# Patient Record
Sex: Female | Born: 1980 | Race: White | Hispanic: No | Smoking: Current some day smoker
Health system: Southern US, Community
[De-identification: ages and names within clinical notes are randomized; demographics above are authoritative.]

## PROBLEM LIST (undated history)

## (undated) DIAGNOSIS — J189 Pneumonia, unspecified organism: Secondary | ICD-10-CM

## (undated) DIAGNOSIS — F419 Anxiety disorder, unspecified: Secondary | ICD-10-CM

## (undated) DIAGNOSIS — E559 Vitamin D deficiency, unspecified: Secondary | ICD-10-CM

## (undated) DIAGNOSIS — I10 Essential (primary) hypertension: Secondary | ICD-10-CM

## (undated) DIAGNOSIS — J42 Unspecified chronic bronchitis: Secondary | ICD-10-CM

## (undated) DIAGNOSIS — Z8619 Personal history of other infectious and parasitic diseases: Secondary | ICD-10-CM

## (undated) DIAGNOSIS — G43909 Migraine, unspecified, not intractable, without status migrainosus: Secondary | ICD-10-CM

## (undated) HISTORY — DX: Personal history of other infectious and parasitic diseases: Z86.19

## (undated) HISTORY — DX: Vitamin D deficiency, unspecified: E55.9

## (undated) HISTORY — DX: Anxiety disorder, unspecified: F41.9

## (undated) HISTORY — DX: Migraine, unspecified, not intractable, without status migrainosus: G43.909

## (undated) HISTORY — DX: Essential (primary) hypertension: I10

## (undated) HISTORY — DX: Pneumonia, unspecified organism: J18.9

## (undated) HISTORY — DX: Unspecified chronic bronchitis: J42

---
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
## 2001-04-26 ENCOUNTER — Emergency Department (HOSPITAL_COMMUNITY): Admission: EM | Admit: 2001-04-26 | Discharge: 2001-04-26 | Payer: Self-pay | Admitting: Emergency Medicine

## 2001-04-27 ENCOUNTER — Encounter: Payer: Self-pay | Admitting: Emergency Medicine

## 2001-10-27 ENCOUNTER — Emergency Department (HOSPITAL_COMMUNITY): Admission: EM | Admit: 2001-10-27 | Discharge: 2001-10-27 | Payer: Self-pay | Admitting: Emergency Medicine

## 2001-11-08 ENCOUNTER — Encounter: Payer: Self-pay | Admitting: Emergency Medicine

## 2001-11-08 ENCOUNTER — Emergency Department (HOSPITAL_COMMUNITY): Admission: EM | Admit: 2001-11-08 | Discharge: 2001-11-08 | Payer: Self-pay | Admitting: Emergency Medicine

## 2004-04-28 ENCOUNTER — Emergency Department (HOSPITAL_COMMUNITY): Admission: EM | Admit: 2004-04-28 | Discharge: 2004-04-29 | Payer: Self-pay | Admitting: Emergency Medicine

## 2007-05-03 ENCOUNTER — Emergency Department (HOSPITAL_COMMUNITY): Admission: EM | Admit: 2007-05-03 | Discharge: 2007-05-03 | Payer: Self-pay | Admitting: Emergency Medicine

## 2007-05-05 ENCOUNTER — Emergency Department (HOSPITAL_COMMUNITY): Admission: EM | Admit: 2007-05-05 | Discharge: 2007-05-05 | Payer: Self-pay | Admitting: Emergency Medicine

## 2007-10-31 ENCOUNTER — Emergency Department (HOSPITAL_COMMUNITY): Admission: EM | Admit: 2007-10-31 | Discharge: 2007-10-31 | Payer: Self-pay | Admitting: Emergency Medicine

## 2009-10-01 ENCOUNTER — Inpatient Hospital Stay (HOSPITAL_COMMUNITY): Admission: AD | Admit: 2009-10-01 | Discharge: 2009-10-01 | Payer: Self-pay | Admitting: Family Medicine

## 2009-10-01 ENCOUNTER — Ambulatory Visit: Payer: Self-pay | Admitting: Obstetrics and Gynecology

## 2010-09-30 LAB — URINALYSIS, ROUTINE W REFLEX MICROSCOPIC
Bilirubin Urine: NEGATIVE
Ketones, ur: NEGATIVE mg/dL
Nitrite: NEGATIVE
Protein, ur: NEGATIVE mg/dL
Urobilinogen, UA: 0.2 mg/dL (ref 0.0–1.0)

## 2010-09-30 LAB — WET PREP, GENITAL

## 2010-09-30 LAB — POCT PREGNANCY, URINE: Preg Test, Ur: POSITIVE

## 2011-04-16 LAB — URINALYSIS, ROUTINE W REFLEX MICROSCOPIC
Glucose, UA: NEGATIVE
Ketones, ur: 15 — AB
Nitrite: NEGATIVE
Protein, ur: 100 — AB
Urobilinogen, UA: 1

## 2011-04-16 LAB — DIFFERENTIAL
Basophils Absolute: 0
Eosinophils Absolute: 0.1
Eosinophils Relative: 1
Lymphocytes Relative: 26

## 2011-04-16 LAB — CBC
HCT: 39.8
MCV: 89.7
Platelets: 262
RDW: 12.9

## 2011-04-16 LAB — URINE CULTURE: Colony Count: NO GROWTH

## 2011-04-16 LAB — I-STAT 8, (EC8 V) (CONVERTED LAB)
Acid-base deficit: 2
Bicarbonate: 21.3
HCT: 42
Operator id: 198171
pCO2, Ven: 30.4 — ABNORMAL LOW

## 2011-04-16 LAB — POCT PREGNANCY, URINE
Operator id: 196461
Preg Test, Ur: NEGATIVE

## 2011-04-16 LAB — URINE MICROSCOPIC-ADD ON

## 2015-04-27 ENCOUNTER — Ambulatory Visit (INDEPENDENT_AMBULATORY_CARE_PROVIDER_SITE_OTHER): Payer: Self-pay | Admitting: Family Medicine

## 2015-04-27 ENCOUNTER — Encounter: Payer: Self-pay | Admitting: Family Medicine

## 2015-04-27 VITALS — BP 130/88 | HR 79 | Temp 98.4°F | Resp 20 | Ht 63.0 in | Wt 162.0 lb

## 2015-04-27 DIAGNOSIS — G43001 Migraine without aura, not intractable, with status migrainosus: Secondary | ICD-10-CM

## 2015-04-27 DIAGNOSIS — Z720 Tobacco use: Secondary | ICD-10-CM | POA: Insufficient documentation

## 2015-04-27 DIAGNOSIS — Z6828 Body mass index (BMI) 28.0-28.9, adult: Secondary | ICD-10-CM | POA: Insufficient documentation

## 2015-04-27 DIAGNOSIS — Z7689 Persons encountering health services in other specified circumstances: Secondary | ICD-10-CM | POA: Insufficient documentation

## 2015-04-27 DIAGNOSIS — Z0000 Encounter for general adult medical examination without abnormal findings: Secondary | ICD-10-CM | POA: Insufficient documentation

## 2015-04-27 DIAGNOSIS — Z7189 Other specified counseling: Secondary | ICD-10-CM

## 2015-04-27 DIAGNOSIS — R5383 Other fatigue: Secondary | ICD-10-CM | POA: Insufficient documentation

## 2015-04-27 DIAGNOSIS — G43909 Migraine, unspecified, not intractable, without status migrainosus: Secondary | ICD-10-CM | POA: Insufficient documentation

## 2015-04-27 LAB — VITAMIN B12: Vitamin B-12: 153 pg/mL — ABNORMAL LOW (ref 211–911)

## 2015-04-27 LAB — VITAMIN D 25 HYDROXY (VIT D DEFICIENCY, FRACTURES): VITD: 23.09 ng/mL — ABNORMAL LOW (ref 30.00–100.00)

## 2015-04-27 LAB — HEMOGLOBIN A1C: Hgb A1c MFr Bld: 5.7 % (ref 4.6–6.5)

## 2015-04-27 LAB — COMPREHENSIVE METABOLIC PANEL
ALK PHOS: 69 U/L (ref 39–117)
ALT: 18 U/L (ref 0–35)
AST: 16 U/L (ref 0–37)
Albumin: 3.9 g/dL (ref 3.5–5.2)
BILIRUBIN TOTAL: 0.4 mg/dL (ref 0.2–1.2)
BUN: 10 mg/dL (ref 6–23)
CALCIUM: 9.3 mg/dL (ref 8.4–10.5)
CO2: 27 mEq/L (ref 19–32)
Chloride: 104 mEq/L (ref 96–112)
Creatinine, Ser: 0.75 mg/dL (ref 0.40–1.20)
GFR: 93.72 mL/min (ref >60.00)
Glucose, Bld: 115 mg/dL — ABNORMAL HIGH (ref 70–99)
Potassium: 4.1 mEq/L (ref 3.5–5.1)
Sodium: 137 mEq/L (ref 135–145)
TOTAL PROTEIN: 7.6 g/dL (ref 6.0–8.3)

## 2015-04-27 LAB — TSH: TSH: 1.02 u[IU]/mL (ref 0.35–4.50)

## 2015-04-27 MED ORDER — SUMATRIPTAN SUCCINATE 50 MG PO TABS: 50.0000 mg | ORAL_TABLET | ORAL | Status: DC | PRN

## 2015-04-27 NOTE — Progress Notes (Signed)
Subjective:    Patient ID: Donna Bright, female    DOB: July 03, 1981, 34 y.o.   MRN: [id number]  HPI  Patient presents for new patient establishment . All past medical history, surgical history, allergies, family history, immunizations and social history was obtained from the patient today and entered into the electronic medical record. Records are requested from her prior PCP, and will be reviewed at the time they are received. All medical records will be updated at that time.   Migraines: Patient reports history of migraines that started approximately 2 years ago. She states that she gets a migraine approximately one time a week. She currently only takes ibuprofen, which she states is not effective. She's never been tried on any types of medications, or seek medical treatment for her migraines. She does admit to photophobia and occasional nausea with her migraines. She states that she does try to go into a dark room and rest, and that seems to be the only thing that does ease off her headache. She does not know of any specific triggers to her migraines.   Tobacco abuse: Patient declines smoking cessation today.`  Health maintenance:  Colonoscopy: No fhx, routine screening at 50 Mammogram: No fhx, routine screening 50 Cervical cancer screening: 2011 last PAP.   Immunizations: td indicated, flu indicated.  Infectious disease screening: HIV indicated   Past Medical History  Diagnosis Date  . Migraines    No Known Allergies Past Surgical History  Procedure Laterality Date  . Cesarean section     Family History  Problem Relation Age of Onset  . Stroke Maternal Grandfather   . Diabetes Maternal Grandfather    Social History   Social History  . Marital Status: Legally Separated    Spouse Name: N/A  . Number of Children: N/A  . Years of Education: N/A   Occupational History  . Not on file.   Social History Main Topics  . Smoking status: Current Every Day Smoker -- 1.00  packs/day    Types: Cigarettes  . Smokeless tobacco: Current User    Types: Chew  . Alcohol Use: Not on file  . Drug Use: Not on file  . Sexual Activity: Not on file   Other Topics Concern  . Not on file   Social History Narrative  . No narrative on file   Review of Systems Negative, with the exception of above mentioned in HPI     Objective:   Physical Exam BP 130/88 mmHg  Pulse 79  Temp(Src) 98.4 F (36.9 C) (Oral)  Resp 20  Ht 5' 3"  (1.6 m)  Wt 162 lb (73.483 kg)  BMI 28.70 kg/m2  SpO2 98%  LMP 04/27/2015 Gen: Afebrile. No acute distress. No acute distress, nontoxic appearance, well-developed, well-nourished, Caucasian female. Very pleasant. HENT: AT. [place]. . MMM. Eyes:Pupils Equal Round Reactive to light, Extraocular movements intact,  Conjunctiva without redness, discharge or icterus. Neck/lymp/endocrine: Supple, no lymphadenopathy, no thyromegaly CV: RRR no murmurs appreciated, no edema, +2/4 P posterior tibialis pulses Chest: CTAB, no wheeze or crackles Abd: Soft. Overweight. NTND. BS present. No Masses palpated.  Neuro: Normal gait. PERLA. EOMi. Alert. Orientedx3 Cranial nerves II through XII intact.  Psych: Normal affect, dress and demeanor. Normal speech. Normal thought content and judgment..      Assessment & Plan:  1. BMI 28.0-28.9,adult - Encouraged at least 150 minutes of exercise weekly, healthy diet full of fresh fruits and vegetables, high-fiber, and low in saturated fats. - HgB A1c - B12 -  Vitamin D (25 hydroxy) - TSH - Comp Met (CMET)  2. Migraine without aura and with status migrainosus, not intractable - Patient to make migraine diary, return 4 weeks. Trial of Imitrex. Depending on frequency and severity of symptoms, would consider daily prophylaxis versus neurological referral. - SUMAtriptan (IMITREX) 50 MG tablet; Take 1 tablet (50 mg total) by mouth every 2 (two) hours as needed for migraine. May repeat in 2 hours if headache persists or  recurs.  Dispense: 10 tablet; Refill: 0  3. Health care maintenance Health maintenance:  Colonoscopy: No fhx, routine screening at 50 Mammogram: No fhx, routine screening 50 Cervical cancer screening: 2011 last PAP.   Immunizations: td decline, flu declined.  Infectious disease screening: HIV declined  4. Other fatigue - HgB A1c - B12 - Vitamin D (25 hydroxy) - TSH - Comp Met (CMET)  5. Encounter to establish care 6. tobacco abuse: Patient declined smoking cessation counseling today patient made aware that she will decide quit smoking that we are here to help her quit.

## 2015-04-27 NOTE — Patient Instructions (Signed)
It was a pleasure meeting you today.  I will call you with labs once available Try to increase your exercise to 3 times a week.  Record a headache diary: dates, time, length, foods, stressors etc included.   Smoking Cessation, Tips for Success If you are ready to quit smoking, congratulations! You have chosen to help yourself be healthier. Cigarettes bring nicotine, tar, carbon monoxide, and other irritants into your body. Your lungs, heart, and blood vessels will be able to work better without these poisons. There are many different ways to quit smoking. Nicotine gum, nicotine patches, a nicotine inhaler, or nicotine nasal spray can help with physical craving. Hypnosis, support groups, and medicines help break the habit of smoking. WHAT THINGS CAN I DO TO MAKE QUITTING EASIER?  Here are some tips to help you quit for good:  Pick a date when you will quit smoking completely. Tell all of your friends and family about your plan to quit on that date.  Do not try to slowly cut down on the number of cigarettes you are smoking. Pick a quit date and quit smoking completely starting on that day.  Throw away all cigarettes.   Clean and remove all ashtrays from your home, work, and car.  On a card, write down your reasons for quitting. Carry the card with you and read it when you get the urge to smoke.  Cleanse your body of nicotine. Drink enough water and fluids to keep your urine clear or pale yellow. Do this after quitting to flush the nicotine from your body.  Learn to predict your moods. Do not let a bad situation be your excuse to have a cigarette. Some situations in your life might tempt you into wanting a cigarette.  Never have "just one" cigarette. It leads to wanting another and another. Remind yourself of your decision to quit.  Change habits associated with smoking. If you smoked while driving or when feeling stressed, try other activities to replace smoking. Stand up when drinking your  coffee. Brush your teeth after eating. Sit in a different chair when you read the paper. Avoid alcohol while trying to quit, and try to drink fewer caffeinated beverages. Alcohol and caffeine may urge you to smoke.  Avoid foods and drinks that can trigger a desire to smoke, such as sugary or spicy foods and alcohol.  Ask people who smoke not to smoke around you.  Have something planned to do right after eating or having a cup of coffee. For example, plan to take a walk or exercise.  Try a relaxation exercise to calm you down and decrease your stress. Remember, you may be tense and nervous for the first 2 weeks after you quit, but this will pass.  Find new activities to keep your hands busy. Play with a pen, coin, or rubber band. Doodle or draw things on paper.  Brush your teeth right after eating. This will help cut down on the craving for the taste of tobacco after meals. You can also try mouthwash.   Use oral substitutes in place of cigarettes. Try using lemon drops, carrots, cinnamon sticks, or chewing gum. Keep them handy so they are available when you have the urge to smoke.  When you have the urge to smoke, try deep breathing.  Designate your home as a nonsmoking area.  If you are a heavy smoker, ask your health care provider about a prescription for nicotine chewing gum. It can ease your withdrawal from nicotine.  Reward  yourself. Set aside the cigarette money you save and buy yourself something nice.  Look for support from others. Join a support group or smoking cessation program. Ask someone at home or at work to help you with your plan to quit smoking.  Always ask yourself, "Do I need this cigarette or is this just a reflex?" Tell yourself, "Today, I choose not to smoke," or "I do not want to smoke." You are reminding yourself of your decision to quit.  Do not replace cigarette smoking with electronic cigarettes (commonly called e-cigarettes). The safety of e-cigarettes is  unknown, and some may contain harmful chemicals.  If you relapse, do not give up! Plan ahead and think about what you will do the next time you get the urge to smoke. HOW WILL I FEEL WHEN I QUIT SMOKING? You may have symptoms of withdrawal because your body is used to nicotine (the addictive substance in cigarettes). You may crave cigarettes, be irritable, feel very hungry, cough often, get headaches, or have difficulty concentrating. The withdrawal symptoms are only temporary. They are strongest when you first quit but will go away within 10-14 days. When withdrawal symptoms occur, stay in control. Think about your reasons for quitting. Remind yourself that these are signs that your body is healing and getting used to being without cigarettes. Remember that withdrawal symptoms are easier to treat than the major diseases that smoking can cause.  Even after the withdrawal is over, expect periodic urges to smoke. However, these cravings are generally short lived and will go away whether you smoke or not. Do not smoke! WHAT RESOURCES ARE AVAILABLE TO HELP ME QUIT SMOKING? Your health care provider can direct you to community resources or hospitals for support, which may include:  Group support.  Education.  Hypnosis.  Therapy.   This information is not intended to replace advice given to you by your health care provider. Make sure you discuss any questions you have with your health care provider.   Document Released: 03/21/2004 Document Revised: 07/14/2014 Document Reviewed: 12/09/2012 Elsevier Interactive Patient Education [employer]

## 2015-04-30 ENCOUNTER — Telehealth: Payer: Self-pay | Admitting: Family Medicine

## 2015-04-30 DIAGNOSIS — E538 Deficiency of other specified B group vitamins: Secondary | ICD-10-CM | POA: Insufficient documentation

## 2015-04-30 DIAGNOSIS — E559 Vitamin D deficiency, unspecified: Secondary | ICD-10-CM | POA: Insufficient documentation

## 2015-04-30 MED ORDER — VITAMIN D (ERGOCALCIFEROL) 1.25 MG (50000 UNIT) PO CAPS: 50000.0000 [IU] | ORAL_CAPSULE | ORAL | Status: DC

## 2015-04-30 MED ORDER — VITAMIN B-12 1000 MCG PO TABS: ORAL_TABLET | ORAL | Status: DC

## 2015-04-30 NOTE — Telephone Encounter (Signed)
Please call patient: - She has very low B-12 and a very low vitamin D. - We will need to start supplementation on both. I have called in a vitamin D supplement for her to take once a week for 12 weeks, we will need to recheck her vitamin D after completion. - Since she does not have insurance, I have called in vitamin B 12 oral supplementation. This will likely be cheaper than the injections. She will need to take these as directed and we will need to follow up her B-12 levels at the same time as her vitamin D levels. - Her a1c, monitors diabetes is mildly elevated at 5.7. We recommend dietary changes at this level, she is not considered a diabetic at this time but is considered a prediabetic. She should decrease the sugar content, and carbohydrate content in her diet. He plenty of healthy fresh fruits and vegetables, lean meats. We will recheck her A1c in 6 months.

## 2015-04-30 NOTE — Telephone Encounter (Signed)
Patient aware of results, diet and prescriptions at pharmacy.  Pt had no further questions at this time.

## 2015-05-28 ENCOUNTER — Other Ambulatory Visit: Payer: Self-pay | Admitting: *Deleted

## 2015-05-28 DIAGNOSIS — E538 Deficiency of other specified B group vitamins: Secondary | ICD-10-CM

## 2015-05-28 MED ORDER — VITAMIN B-12 1000 MCG PO TABS: ORAL_TABLET | ORAL | Status: DC

## 2015-05-28 NOTE — Telephone Encounter (Signed)
B12 refilled per refill protocol

## 2015-07-19 ENCOUNTER — Other Ambulatory Visit: Payer: Self-pay | Admitting: *Deleted

## 2015-07-19 ENCOUNTER — Telehealth: Payer: Self-pay | Admitting: Family Medicine

## 2015-07-19 DIAGNOSIS — E559 Vitamin D deficiency, unspecified: Secondary | ICD-10-CM

## 2015-07-19 NOTE — Telephone Encounter (Signed)
Please call pt: - I received a request for vit d refill, we would only do refill for prescribed dose if we retest her level and it is low again.  - She needs her vit D  And B12 rechecked to make certain medications brought her levels to normal.  - I believe she was having insurance issues, and may not have coverage currently to pay for re-testing.  - she should continue OTC b12 and Vit d ([phone]) regardless if we test again or not.  - If she desires levels to be drawn please place future order for her

## 2015-07-19 NOTE — Telephone Encounter (Signed)
RF request for Vitamin D 50, 000units LOV: 04/27/15 Next ov: None Last written: 04/30/15 #12 w/ 0RF  Please advise.Thanks.

## 2015-07-19 NOTE — Telephone Encounter (Signed)
Spoke to pt and she stated that she does not have insurance and would like to take the otc B12 and Vit D.

## 2020-05-26 ENCOUNTER — Encounter (HOSPITAL_COMMUNITY): Payer: Self-pay | Admitting: Emergency Medicine

## 2020-05-26 ENCOUNTER — Emergency Department (HOSPITAL_COMMUNITY): Payer: No Typology Code available for payment source

## 2020-05-26 ENCOUNTER — Other Ambulatory Visit: Payer: Self-pay

## 2020-05-26 ENCOUNTER — Emergency Department (HOSPITAL_COMMUNITY)
Admission: EM | Admit: 2020-05-26 | Discharge: 2020-05-26 | Disposition: A | Discharge status: Home / Self Care | Payer: No Typology Code available for payment source | Attending: Emergency Medicine | Admitting: Emergency Medicine

## 2020-05-26 DIAGNOSIS — F17210 Nicotine dependence, cigarettes, uncomplicated: Secondary | ICD-10-CM | POA: Diagnosis not present

## 2020-05-26 DIAGNOSIS — M25512 Pain in left shoulder: Secondary | ICD-10-CM | POA: Diagnosis present

## 2020-05-26 NOTE — ED Triage Notes (Signed)
Patient arrives to ED with complaints of left shoulder pain x1 week. Pt states pain is sharp and is intermittent. OTC aleve tends to help pain. No direct injury to shoulder.

## 2020-05-26 NOTE — Discharge Instructions (Signed)
You were seen in the ER for shoulder pain  X-ray was normal  Your pain is likely from inflammation  Alternate ibuprofen (advil, motrin), aleve or tylenol every 6-8 hours for the next 5 days  Rest, use sling as needed for comfort  Ice  Follow up with primary care doctor in 1 week for re-evaluation if pain not improving  Return for worsening or new symptoms

## 2020-05-26 NOTE — ED Provider Notes (Signed)
[facility] EMERGENCY DEPARTMENT Provider Note   CSN: [id number] Arrival date & time: 05/26/20  1436     History Chief Complaint  Patient presents with   Shoulder Pain    Donna Bright is a 39 y.o. female presents to the ED for evaluation of left-sided shoulder pain for the last week.  Constant.  Located along left trapezius with radiation to the lateral shoulder, left-sided neck.  Worse with movement, palpation, laying on it.  Has taken Aleve which helps with the pain.  She is right-handed.  No previous known injuries, surgeries, fractures or dislocations of this joint.  Denies associated distal tingling, decreased sensation, weakness, chest pain, shortness of breath, back pain.  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  Denies history of rheumatoid arthritis.  States she feels "stiff".  No fevers, rash, redness.  HPI     Past Medical History:  Diagnosis Date   History of chicken pox    Migraines     Patient Active Problem List   Diagnosis Date Noted   Vitamin D deficiency 04/30/2015   Vitamin B12 deficiency 04/30/2015   BMI 28.0-28.9,adult 04/27/2015   Migraine 04/27/2015   Health care maintenance 04/27/2015   Fatigue 04/27/2015   Encounter to establish care 04/27/2015   Tobacco abuse 04/27/2015    Past Surgical History:  Procedure Laterality Date   CESAREAN SECTION       OB History   No obstetric history on file.     Family History  Problem Relation Age of Onset   Stroke Maternal Grandfather    Diabetes Maternal Grandfather     Social History   Tobacco Use   Smoking status: Current Every Day Smoker    Packs/day: 1.00    Types: Cigarettes   Smokeless tobacco: Current User  Substance Use Topics   Alcohol use: No    Alcohol/week: 0.0 standard drinks   Drug use: No    Home Medications Prior to Admission medications   Medication Sig Start Date End  Date Taking? Authorizing Provider  SUMAtriptan (IMITREX) 50 MG tablet Take 1 tablet (50 mg total) by mouth every 2 (two) hours as needed for migraine. May repeat in 2 hours if headache persists or recurs. 04/27/15   Kuneff, Renee A, DO  vitamin B-12 (CYANOCOBALAMIN) 1000 MCG tablet take 1000 mcg monthly 05/28/15   Kuneff, Renee A, DO  Vitamin D, Ergocalciferol, (DRISDOL) 50000 UNITS CAPS capsule Take 1 capsule (50,000 Units total) by mouth every 7 (seven) days. 04/30/15   Kuneff, Renee A, DO    Allergies    Patient has no known allergies.  Review of Systems   Review of Systems  Musculoskeletal: Positive for arthralgias and myalgias.  All other systems reviewed and are negative.   Physical Exam Updated Vital Signs BP 130/84 (BP Location: Right Arm)    Pulse 87    Temp 98 F (36.7 C) (Oral)    Resp 16    Ht 5\' 1"  (1.549 m)    Wt 72.6 kg    SpO2 100%    BMI 30.23 kg/m   Physical Exam Vitals and nursing note reviewed.  Constitutional:      General: She is not in acute distress.    Appearance: She is well-developed.     Comments: NAD.  HENT:     Head: Normocephalic and atraumatic.     Right Ear: External ear normal.  Left Ear: External ear normal.     Nose: Nose normal.  Eyes:     General: No scleral icterus.    Conjunctiva/sclera: Conjunctivae normal.  Cardiovascular:     Rate and Rhythm: Normal rate and regular rhythm.     Heart sounds: Normal heart sounds.     Comments: 1+ radial LUE pulse  Pulmonary:     Effort: Pulmonary effort is normal.     Breath sounds: Normal breath sounds.  Musculoskeletal:        General: Tenderness present. Normal range of motion.     Cervical back: Normal range of motion and neck supple.     Comments: C-spine: No midline or paraspinal muscular tenderness.  Full range of motion of the neck without any pain.  Left shoulder: TTP along left top trapezius, lateral and posterior deltoid and supra scapular area.  Pain with shoulder abduction.  Positive Neer's, Speed's. Negative Hawkin's. No obvious skin abnormalities including abrasions, ecchymosis, erythema, edema No point tenderness to sternum, anterior chest wall, scapula, clavicle, AC or  joints. Negative drop arm test (rotator cuff tear).   Compartments soft in left upper/lower arm.   No focal bony tenderness of left elbow   Skin:    General: Skin is warm and dry.     Capillary Refill: Capillary refill takes less than 2 seconds.  Neurological:     Mental Status: She is alert and oriented to person, place, and time.     Comments: Sensation and strength intact in LUE  Psychiatric:        Behavior: Behavior normal.        Thought Content: Thought content normal.        Judgment: Judgment normal.     ED Results / Procedures / Treatments   Labs (all labs ordered are listed, but only abnormal results are displayed) Labs Reviewed - No data to display  EKG None  Radiology DG Shoulder Left  Result Date: 05/26/2020 CLINICAL DATA:  Intermittent left shoulder pain EXAM: LEFT SHOULDER - 2+ VIEW COMPARISON:  None. FINDINGS: There is no evidence of fracture or dislocation. There is no evidence of arthropathy or other focal bone abnormality. Soft tissues are unremarkable. IMPRESSION: No acute osseous finding or malalignment. Electronically Signed   By: Judie Petit.  Shick M.D.   On: 05/26/2020 15:20    Procedures Procedures (including critical care time)  Medications Ordered in ED Medications - No data to display  ED Course  I have reviewed the triage vital signs and the nursing notes.  Pertinent labs & imaging results that were available during my care of the patient were reviewed by me and considered in my medical decision making (see chart for details).    MDM Rules/Calculators/A&P                          EMR, triage nurse notes reviewed to obtain more history and MDM  X-ray ordered in triage personally visualized and interpreted-no acute bony abnormalities,  dislocation, fracture.  Clinical presentation is most consistent with soft tissue injury.  DDx includes tendinitis, spasm, strain.  No clinical findings to suggest infection, septic arthritis, abscess, cellulitis, DVT.  She has no radicular symptoms.  We will discharge with symptomatic management, rest, shoulder sling and PCP follow-up for continued symptoms.  Return precautions discussed.  She is comfortable this plan. Final Clinical Impression(s) / ED Diagnoses Final diagnoses:  Acute pain of left shoulder    Rx / DC Orders  ED Discharge Orders    None       Jerrell Mylar 05/26/20 1619    Charlynne Pander, MD 05/26/20 [phone]

## 2020-05-26 NOTE — ED Notes (Signed)
Patient verbalizes understanding of discharge instructions. Opportunity for questioning and answers were provided. Arm band removed by staff, patient discharged from ED. 

## 2021-02-26 DIAGNOSIS — R5383 Other fatigue: Secondary | ICD-10-CM | POA: Diagnosis not present

## 2021-02-26 DIAGNOSIS — U071 COVID-19: Secondary | ICD-10-CM | POA: Diagnosis not present

## 2021-02-26 DIAGNOSIS — R0981 Nasal congestion: Secondary | ICD-10-CM | POA: Diagnosis not present

## 2021-02-26 DIAGNOSIS — J029 Acute pharyngitis, unspecified: Secondary | ICD-10-CM | POA: Diagnosis not present

## 2021-02-28 DIAGNOSIS — L509 Urticaria, unspecified: Secondary | ICD-10-CM | POA: Diagnosis not present

## 2021-03-05 DIAGNOSIS — U071 COVID-19: Secondary | ICD-10-CM | POA: Diagnosis not present

## 2021-03-05 DIAGNOSIS — R5383 Other fatigue: Secondary | ICD-10-CM | POA: Diagnosis not present

## 2021-03-08 DIAGNOSIS — F17210 Nicotine dependence, cigarettes, uncomplicated: Secondary | ICD-10-CM | POA: Diagnosis not present

## 2021-03-08 DIAGNOSIS — Z79899 Other long term (current) drug therapy: Secondary | ICD-10-CM | POA: Diagnosis not present

## 2021-03-08 DIAGNOSIS — R5383 Other fatigue: Secondary | ICD-10-CM | POA: Diagnosis not present

## 2021-03-08 DIAGNOSIS — R1010 Upper abdominal pain, unspecified: Secondary | ICD-10-CM | POA: Diagnosis not present

## 2021-03-08 DIAGNOSIS — R0789 Other chest pain: Secondary | ICD-10-CM | POA: Diagnosis not present

## 2021-03-08 DIAGNOSIS — R079 Chest pain, unspecified: Secondary | ICD-10-CM | POA: Diagnosis not present

## 2021-03-08 DIAGNOSIS — Z882 Allergy status to sulfonamides status: Secondary | ICD-10-CM | POA: Diagnosis not present

## 2021-03-08 DIAGNOSIS — R9431 Abnormal electrocardiogram [ECG] [EKG]: Secondary | ICD-10-CM | POA: Diagnosis not present

## 2021-03-09 DIAGNOSIS — R0789 Other chest pain: Secondary | ICD-10-CM | POA: Diagnosis not present

## 2021-03-27 DIAGNOSIS — S29011A Strain of muscle and tendon of front wall of thorax, initial encounter: Secondary | ICD-10-CM | POA: Diagnosis not present

## 2021-03-27 DIAGNOSIS — U071 COVID-19: Secondary | ICD-10-CM | POA: Diagnosis not present

## 2021-03-27 DIAGNOSIS — U099 Post covid-19 condition, unspecified: Secondary | ICD-10-CM | POA: Diagnosis not present

## 2021-04-09 ENCOUNTER — Ambulatory Visit: Payer: BC Managed Care – PPO | Admitting: Family Medicine

## 2021-05-07 DIAGNOSIS — Z20822 Contact with and (suspected) exposure to covid-19: Secondary | ICD-10-CM | POA: Diagnosis not present

## 2021-05-10 ENCOUNTER — Other Ambulatory Visit: Payer: Self-pay

## 2021-05-10 ENCOUNTER — Ambulatory Visit (INDEPENDENT_AMBULATORY_CARE_PROVIDER_SITE_OTHER): Payer: BC Managed Care – PPO

## 2021-05-10 ENCOUNTER — Encounter: Payer: Self-pay | Admitting: Family Medicine

## 2021-05-10 ENCOUNTER — Telehealth (INDEPENDENT_AMBULATORY_CARE_PROVIDER_SITE_OTHER): Payer: BC Managed Care – PPO | Admitting: Family Medicine

## 2021-05-10 DIAGNOSIS — R509 Fever, unspecified: Secondary | ICD-10-CM | POA: Diagnosis not present

## 2021-05-10 DIAGNOSIS — R0602 Shortness of breath: Secondary | ICD-10-CM

## 2021-05-10 DIAGNOSIS — R519 Headache, unspecified: Secondary | ICD-10-CM

## 2021-05-10 DIAGNOSIS — J069 Acute upper respiratory infection, unspecified: Secondary | ICD-10-CM

## 2021-05-10 LAB — VERITOR FLU A/B WAIVED
Influenza A: NEGATIVE
Influenza B: NEGATIVE

## 2021-05-10 NOTE — Progress Notes (Signed)
Virtual Visit via Video note  I connected with Donna Bright on 05/10/21 at 4:00 PM by video and verified that I am speaking with the correct person using two identifiers. Donna Bright is currently located in her vehicle and her sister is currently with her during visit. The provider, Gwenlyn Fudge, FNP is located in their office at time of visit.  I discussed the limitations, risks, security and privacy concerns of performing an evaluation and management service by video and the availability of in person appointments. I also discussed with the patient that there may be a patient responsible charge related to this service. The patient expressed understanding and agreed to proceed.  Subjective: PCP: No primary care provider on file.  Chief Complaint  Patient presents with   New Patient (Initial Visit)   Fever   Chills   Abdominal Pain   Shortness of Breath   Patient complains of fever, shortness of breath, abdominal pain, and chills . Max temp 99.0. Additional symptoms include headache and sneezing. Onset of symptoms was 4 days ago, unchanged since that time. She is drinking moderate amounts of fluids. Evaluation to date: previously tested for COVID which was negative. Treatment to date:  Ibuprofen . She does smoke. She reports she has had pneumonia twice and COVID three times. She is worried she may have pneumonia again.   ROS: Per HPI  Current Outpatient Medications:    albuterol (VENTOLIN HFA) 108 (90 Base) MCG/ACT inhaler, Inhale into the lungs., Disp: , Rfl:    escitalopram (LEXAPRO) 10 MG tablet, Take 10 mg by mouth daily., Disp: , Rfl:   Allergies  Allergen Reactions   Sulfamethoxazole-Trimethoprim Hives and Rash   Past Medical History:  Diagnosis Date   Anxiety    History of chicken pox    Migraines     Observations/Objective: Physical Exam Constitutional:      General: She is not in acute distress.    Appearance: Normal appearance. She is not ill-appearing  or toxic-appearing.  Eyes:     General: No scleral icterus.       Right eye: No discharge.        Left eye: No discharge.     Conjunctiva/sclera: Conjunctivae normal.  Pulmonary:     Effort: Pulmonary effort is normal. No respiratory distress.  Neurological:     Mental Status: She is alert and oriented to person, place, and time.  Psychiatric:        Mood and Affect: Mood normal.        Behavior: Behavior normal.        Thought Content: Thought content normal.        Judgment: Judgment normal.   Assessment and Plan: 1. Viral URI Symptom management.  2. Shortness of breath - DG Chest 2 View; Future - Rx'd Albuterol inhaler  3. Acute nonintractable headache, unspecified headache type - Veritor Flu A/B Waived; Future - Novel Coronavirus, NAA ([facility]); Future   Follow Up Instructions:   I discussed the assessment and treatment plan with the patient. The patient was provided an opportunity to ask questions and all were answered. The patient agreed with the plan and demonstrated an understanding of the instructions.   The patient was advised to call back or seek an in-person evaluation if the symptoms worsen or if the condition fails to improve as anticipated.  The above assessment and management plan was discussed with the patient. The patient verbalized understanding of and has agreed to the management plan.  Patient is aware to call the clinic if symptoms persist or worsen. Patient is aware when to return to the clinic for a follow-up visit. Patient educated on when it is appropriate to go to the emergency department.   Time call ended: 4:11 PM  I provided 11 minutes of face-to-face time during this encounter.   Deliah Boston, MSN, APRN, FNP-C [facility] 05/10/21

## 2021-05-11 LAB — SARS-COV-2, NAA 2 DAY TAT

## 2021-05-11 LAB — NOVEL CORONAVIRUS, NAA: SARS-CoV-2, NAA: NOT DETECTED

## 2021-05-13 ENCOUNTER — Encounter: Payer: Self-pay | Admitting: Family Medicine

## 2021-05-14 ENCOUNTER — Encounter: Payer: Self-pay | Admitting: Family Medicine

## 2021-05-15 ENCOUNTER — Other Ambulatory Visit: Payer: Self-pay

## 2021-05-15 ENCOUNTER — Ambulatory Visit (INDEPENDENT_AMBULATORY_CARE_PROVIDER_SITE_OTHER): Payer: BC Managed Care – PPO | Admitting: Family Medicine

## 2021-05-15 ENCOUNTER — Encounter: Payer: Self-pay | Admitting: Family Medicine

## 2021-05-15 VITALS — BP 138/94 | HR 87 | Ht 61.0 in | Wt 167.0 lb

## 2021-05-15 DIAGNOSIS — J42 Unspecified chronic bronchitis: Secondary | ICD-10-CM

## 2021-05-15 DIAGNOSIS — F17210 Nicotine dependence, cigarettes, uncomplicated: Secondary | ICD-10-CM | POA: Diagnosis not present

## 2021-05-15 DIAGNOSIS — Z720 Tobacco use: Secondary | ICD-10-CM | POA: Diagnosis not present

## 2021-05-15 NOTE — Progress Notes (Signed)
BP (!) 138/94   Pulse 87   Ht 5\' 1"  (1.549 m)   Wt 167 lb (75.8 kg)   SpO2 98%   BMI 31.55 kg/m    Subjective:   Patient ID: , female    DOB: [birth year], 40 y.o.   MRN: [id number]  HPI: Donna Bright is a 40 y.o. female presenting on 05/15/2021 for Chest Pain (States that lungs hurt when yawning or taking a deep breath)   HPI Chest pain and trouble breathing Patient is coming in today for continued chest pain and trouble breathing.  She has been subject has been fighting since she had COVID and pneumonia about 6-7 months ago.  She just feels tight in her chest whenever she takes deep breath she feels like she cannot fully expand and has some pain with it.  She also has some coughing spells and fits and feels short of breath and tightness with those as well.  Patient denies any fevers or chills.  She denies any sinus pressure or drainage.  She says the pain is always there is a low-grade amount but then it definitely increases when she takes deep breaths.  She did have a CT for PE on May 8 as well that did not find any PEs.  She does use albuterol occasionally and it does help some.  Patient still smokes and wants to quit but she does not want to try anything at this point.  She did have a chest x-ray last week that was clear as well.  Relevant past medical, surgical, family and social history reviewed and updated as indicated. Interim medical history since our last visit reviewed. Allergies and medications reviewed and updated.  Review of Systems  Constitutional:  Negative for chills and fever.  HENT:  Negative for congestion, ear discharge and ear pain.   Eyes:  Negative for redness and visual disturbance.  Respiratory:  Positive for cough, chest tightness and shortness of breath. Negative for wheezing.   Cardiovascular:  Negative for chest pain, palpitations and leg swelling.  Genitourinary:  Negative for difficulty urinating and dysuria.  Musculoskeletal:  Negative  for back pain and gait problem.  Skin:  Negative for rash.  Neurological:  Negative for light-headedness and headaches.  Psychiatric/Behavioral:  Negative for agitation and behavioral problems.   All other systems reviewed and are negative.  Per HPI unless specifically indicated above   Allergies as of 05/15/2021       Reactions   Sulfamethoxazole-trimethoprim Hives, Rash        Medication List        Accurate as of May 15, 2021 11:54 AM. If you have any questions, ask your nurse or doctor.          albuterol 108 (90 Base) MCG/ACT inhaler Commonly known as: VENTOLIN HFA Inhale into the lungs.   escitalopram 10 MG tablet Commonly known as: LEXAPRO Take 10 mg by mouth daily.         Objective:   BP (!) 138/94   Pulse 87   Ht 5\' 1"  (1.549 m)   Wt 167 lb (75.8 kg)   SpO2 98%   BMI 31.55 kg/m   Wt Readings from Last 3 Encounters:  05/15/21 167 lb (75.8 kg)  05/26/20 160 lb (72.6 kg)  04/27/15 162 lb (73.5 kg)    Physical Exam Vitals and nursing note reviewed.  Constitutional:      General: She is not in acute distress.    Appearance: She  is well-developed. She is not diaphoretic.  Eyes:     Conjunctiva/sclera: Conjunctivae normal.     Pupils: Pupils are equal, round, and reactive to light.  Cardiovascular:     Rate and Rhythm: Normal rate and regular rhythm.     Heart sounds: Normal heart sounds. No murmur heard. Pulmonary:     Effort: Pulmonary effort is normal. No respiratory distress.     Breath sounds: Normal breath sounds. No wheezing.  Musculoskeletal:        General: No tenderness. Normal range of motion.  Skin:    General: Skin is warm and dry.     Findings: No rash.  Neurological:     Mental Status: She is alert and oriented to person, place, and time.     Coordination: Coordination normal.  Psychiatric:        Behavior: Behavior normal.      Assessment & Plan:   Problem List Items Addressed This Visit       Other    Tobacco abuse   Relevant Orders   Ambulatory referral to Pulmonology   Other Visit Diagnoses     Chronic bronchitis, unspecified chronic bronchitis type ([place])    -  Primary   Relevant Orders   Ambulatory referral to Pulmonology   Smokes less than 1 pack a day with greater than 25 pack year history       Relevant Orders   Ambulatory referral to Pulmonology     Will refer to pulmonology for possible COPD testing.  We will give sample of inhaled corticosteroid and see if it helps make a difference in the meantime.  Gave samples of Breztri, because that is all that we had.  Follow up plan: Return in about 4 weeks (around 06/12/2021), or if symptoms worsen or fail to improve, for Follow-up chest tightness and bronchitis and COPD in 1 month with PCP.  Counseling provided for all of the vaccine components Orders Placed This Encounter  Procedures   Ambulatory referral to [place], MD [facility] 05/15/2021, 11:54 AM

## 2021-05-23 ENCOUNTER — Other Ambulatory Visit: Payer: Self-pay

## 2021-05-23 ENCOUNTER — Encounter: Payer: Self-pay | Admitting: Internal Medicine

## 2021-05-23 ENCOUNTER — Ambulatory Visit (INDEPENDENT_AMBULATORY_CARE_PROVIDER_SITE_OTHER): Payer: BC Managed Care – PPO | Admitting: Internal Medicine

## 2021-05-23 VITALS — BP 128/82 | HR 87 | Temp 97.5°F | Ht 61.0 in | Wt 167.0 lb

## 2021-05-23 DIAGNOSIS — R0602 Shortness of breath: Secondary | ICD-10-CM

## 2021-05-23 DIAGNOSIS — F17210 Nicotine dependence, cigarettes, uncomplicated: Secondary | ICD-10-CM

## 2021-05-23 DIAGNOSIS — F17200 Nicotine dependence, unspecified, uncomplicated: Secondary | ICD-10-CM

## 2021-05-23 DIAGNOSIS — J42 Unspecified chronic bronchitis: Secondary | ICD-10-CM | POA: Diagnosis not present

## 2021-05-23 MED ORDER — PROAIR RESPICLICK 108 (90 BASE) MCG/ACT IN AEPB: 2.0000 | INHALATION_SPRAY | RESPIRATORY_TRACT | 5 refills | Status: DC | PRN

## 2021-05-23 MED ORDER — STIOLTO RESPIMAT 2.5-2.5 MCG/ACT IN AERS: 1.0000 | INHALATION_SPRAY | Freq: Every day | RESPIRATORY_TRACT | 5 refills | Status: DC

## 2021-05-23 MED ORDER — BUPROPION HCL ER (SMOKING DET) 150 MG PO TB12: 150.0000 mg | ORAL_TABLET | Freq: Two times a day (BID) | ORAL | 5 refills | Status: DC

## 2021-05-23 NOTE — Patient Instructions (Addendum)
Please schedule follow up scheduled with myself in 2 months.    Start stiolto 1 puff once a day. Continue albuterol as needed.   Before your next visit I would like you to have: Full set of PFTs - 1 hour with appointment with me after.   Bupropion -- Bupropion (brand names: Zyban, Wellbutrin) is an antidepressant that can be used to help you stop smoking.  Start taking it once per day for three days, then increase to twice daily starting four weeks before the quit date.  You should typically continue for 7 to 12 weeks after you quit smoking.  Please do not stop taking this medication abruptly.  When you are ready to stop we will have you go to once daily for two weeks and then you can do once every other day for a week before you stop.   Bupropion is generally well-tolerated, but it may cause dry mouth and difficulty sleeping. The drug should not be used by people who have a seizure disorder or bipolar (manic-depressive) disorder, and it is not recommended for those who have head trauma, anorexia nervosa, or bulimia, or for those who drink alcohol excessively.  What are the benefits of quitting smoking? Quitting smoking can lower your chances of getting or dying from heart disease, lung disease, kidney failure, infection, or cancer. It can also lower your chances of getting osteoporosis, a condition that makes your bones weak. Plus, quitting smoking can help your skin look younger and reduce the chances that you will have problems with sex.  Quitting smoking will improve your health no matter how old you are, and no matter how long or how much you have smoked.  What should I do if I want to quit smoking? The letters in the word "START" can help you remember the steps to take: S = Set a quit date. T = Tell family, friends, and the people around you that you plan to quit. A = Anticipate or plan ahead for the tough times you'll face while quitting. R = Remove cigarettes and other tobacco products  from your home, car, and work. T = Talk to your doctor about getting help to quit.  How can my doctor or nurse help? Your doctor or nurse can give you advice on the best way to quit. He or she can also put you in touch with counselors or other people you can call for support. Plus, your doctor or nurse can give you medicines to: ?Reduce your craving for cigarettes ?Reduce the unpleasant symptoms that happen when you stop smoking (called "withdrawal symptoms"). You can also get help from a free phone line (1-800-QUIT-NOW) or go online to [employer].  What are the symptoms of withdrawal? The symptoms include: ?Trouble sleeping ?Being irritable, anxious or restless ?Getting frustrated or angry ?Having trouble thinking clearly  Some people who stop smoking become temporarily depressed. Some people need treatment for depression, such as counseling or antidepressant medicines. Depressed people might: ?No longer enjoy or care about doing the things they used to like to do ?Feel sad, down, hopeless, nervous, or cranky most of the day, almost every day ?Lose or gain weight ?Sleep too much or too little ?Feel tired or like they have no energy ?Feel guilty or like they are worth nothing ?Forget things or feel confused ?Move and speak more slowly than usual ?Act restless or have trouble staying still ?Think about death or suicide  If you think you might be depressed, see your doctor or  nurse. Only someone trained in mental health can tell for sure if you are depressed. If you ever feel like you might hurt yourself, go straight to the nearest emergency department. Or you can call for an ambulance (in the [place] and [place], dial 9-1-1) or call your doctor or nurse right away and tell them it is an emergency. You can also reach the [place] National Suicide Prevention Lifeline at 978-830-1293 or http://hill.com/.  How do medicines help you stop smoking? Different medicines work in  different ways: ?Nicotine replacement therapy eases withdrawal and reduces your body's craving for nicotine, the main drug found in cigarettes. There are different forms of nicotine replacement, including skin patches, lozenges, gum, nasal sprays, and "puffers" or inhalers. Many can be bought without a prescription, while others might require one. ?Bupropion is a prescription medicine that reduces your desire to smoke. This medicine is sold under the brand names Zyban and Wellbutrin. It is also available in a generic version, which is cheaper than brand name medicines. ?Varenicline (brand names: Chantix, Champix) is a prescription medicine that reduces withdrawal symptoms and cigarette cravings. If you think you'd like to take varenicline and you have a history of depression, anxiety, or heart disease, discuss this with your doctor or nurse before taking the medicine. Varenicline can also increase the effects of alcohol in some people. It's a good idea to limit drinking while you're taking it, at least until you know how it affects you.  How does counseling work? Counseling can happen during formal office visits or just over the phone. A counselor can help you: ?Figure out what triggers your smoking and what to do instead ?Overcome cravings ?Figure out what went wrong when you tried to quit before  What works best? Studies show that people have the best luck at quitting if they take medicines to help them quit and work with a Veterinary surgeon. It might also be helpful to combine nicotine replacement with one of the prescription medicines that help people quit. In some cases, it might even make sense to take bupropion and varenicline together.  What about e-cigarettes? Sometimes people wonder if using electronic cigarettes, or "e-cigarettes," might help them quit smoking. Using e-cigarettes is also called "vaping." Doctors do not recommend e-cigarettes in place of medicines and counseling. That's because  e-cigarettes still contain nicotine as well as other substances that might be harmful. It's not clear how they can affect a person's health in the long term.  Will I gain weight if I quit? Yes, you might gain a few pounds. But quitting smoking will have a much more positive effect on your health than weighing a few pounds more. Plus, you can help prevent some weight gain by being more active and eating less. Taking the medicine bupropion might help control weight gain.   What else can I do to improve my chances of quitting? You can: ?Start exercising. ?Stay away from smokers and places that you associate with smoking. If people close to you smoke, ask them to quit with you. ?Keep gum, hard candy, or something to put in your mouth handy. If you get a craving for a cigarette, try one of these instead. ?Don't give up, even if you start smoking again. It takes most people a few tries before they succeed.  What if I am pregnant and I smoke? If you are pregnant, it's really important for the health of your baby that you quit. Ask your doctor what options you have, and what is safest  for your baby

## 2021-05-23 NOTE — Progress Notes (Signed)
The patient has been prescribed the inhaler stiolto. Inhaler technique was demonstrated to patient. The patient subsequently demonstrated correct technique.  

## 2021-05-23 NOTE — Progress Notes (Signed)
Donna Bright    [id number]    1981/06/14  Primary Care Physician:Joyce, Robin Searing, FNP  Referring Physician: Dettinger, Elige Radon, MD [address] [place],  [place] [address] Reason for Consultation: chronic bronchitis Date of Consultation: 05/23/2021  Chief complaint:   Chief Complaint  Patient presents with   Consult    Patient reports that she has had recurrent PNA and pain in lungs front and back and left shoulder.      HPI: Donna Bright is a 40 y.o. woman who presents for new patient evaluation of dyspnea and recurrent bronchitis.   Has had covid twice and bronchitis 3 times in the last year. Was treated at home with abx and and steroids, was not hosptitalized. Treated by PCP. Symptoms improved.   She is now having ongoing fatigue, chest pressure and tightness which she usually notices with exertion and before bedtime. She has been taking ibuprofen for this discomfort and pain which helps a little while but then comes right back.   No childhood respiratory disease, asthma, bronchitis.   She was prescribed breztri and albuterol by PCP. She does think it helps.   Social history:  Occupation: works at [employer]: lives at home with daughter Smoking history: she smokes 1 ppd  x 25 years (since age 72.)  Social History   Occupational History   Not on file  Tobacco Use   Smoking status: Every Day    Packs/day: 1.00    Types: Cigarettes   Smokeless tobacco: Current  Substance and Sexual Activity   Alcohol use: No    Alcohol/week: 0.0 standard drinks   Drug use: No   Sexual activity: Never    Birth control/protection: None    Relevant family history:  Family History  Problem Relation Age of Onset   Stroke Maternal Grandfather    Diabetes Maternal Grandfather    Emphysema Maternal Uncle     Past Medical History:  Diagnosis Date   Anxiety    History of chicken pox    Migraines    Pneumonia     Past Surgical History:  Procedure  Laterality Date   CESAREAN SECTION       Physical Exam: Blood pressure 128/82, pulse 87, temperature (!) 97.5 F (36.4 C), temperature source Oral, height 5\' 1"  (1.549 m), weight 167 lb (75.8 kg), last menstrual period 05/09/2021, SpO2 97 %. Gen:      No acute distress ENT:  no nasal polyps, mucus membranes moist Lungs:    No increased respiratory effort, symmetric chest wall excursion, clear to auscultation bilaterally, no wheezes or crackles CV:         Regular rate and rhythm; no murmurs, rubs, or gallops.  No pedal edema Abd:      + bowel sounds; soft, non-tender; no distension MSK: no acute synovitis of DIP or PIP joints, no mechanics hands.  Skin:      Warm and dry; no rashes Neuro: normal speech, no focal facial asymmetry Psych: alert and oriented x3, normal mood and affect   Data Reviewed/Medical Decision Making:  Independent interpretation of tests: Imaging:  Review of patient's chest xray Nov 2022 images revealed no acute cardiopulmonary process. The patient's images have been independently reviewed by me.    PFTs: None on file No flowsheet data found.  Labs:  Lab Results  Component Value Date   WBC 8.7 05/05/2007   HGB 14.3 05/05/2007   HCT 42.0 05/05/2007  MCV 89.7 05/05/2007   PLT 262 05/05/2007   Lab Results  Component Value Date   NA 137 04/27/2015   K 4.1 04/27/2015   CL 104 04/27/2015   CO2 27 04/27/2015     Immunization status:  Immunization History  Administered Date(s) Administered   Tdap 05/26/2010     I reviewed prior external note(s) from PCP  I reviewed the result(s) of the labs and imaging as noted above.   I have ordered PFTs   Assessment:  Chronic Bronchitis Tobacco use disorder   Plan/Recommendations:  Stop Breztri. Start stiolto. Continue albuterol as needed. Will refill.  Discussed likely this is early COPD. Will obtain a full set of PFTs at next visit. Will start wellbutrin for smoking cessation.    Smoking  Cessation Counseling:  1. The patient is an everyday smoker and symptomatic due to the following condition chronic bronchitis 2. The patient is currently contemplative in quitting smoking. 3. I advised patient to quit smoking. 4. We identified patient specific barriers to change.  5. I personally spent 4 minutes counseling the patient regarding tobacco use disorder. 6. We discussed management of stress and anxiety to help with smoking cessation, when applicable. 7. We discussed nicotine replacement therapy, Wellbutrin, Chantix as possible options. 8. I advised setting a quit date. 9. Follow?up arranged with our office to continue ongoing discussions. 10.Resources given to patient including quit hotline.    We discussed disease management and progression at length today.    Return to Care: Return in about 2 months (around 07/23/2021).  Durel Salts, MD Pulmonary and Critical Care Medicine [facility] Office:[phone]  CC: Dettinger, Elige Radon, MD

## 2021-05-24 ENCOUNTER — Other Ambulatory Visit (HOSPITAL_COMMUNITY): Payer: Self-pay

## 2021-05-24 ENCOUNTER — Encounter: Payer: Self-pay | Admitting: Internal Medicine

## 2021-05-24 DIAGNOSIS — Z716 Tobacco abuse counseling: Secondary | ICD-10-CM

## 2021-05-24 MED ORDER — VARENICLINE TARTRATE 1 MG PO TABS: 1.0000 mg | ORAL_TABLET | Freq: Two times a day (BID) | ORAL | 5 refills | Status: DC

## 2021-05-24 MED ORDER — VARENICLINE TARTRATE 0.5 MG PO TABS: 0.5000 mg | ORAL_TABLET | Freq: Two times a day (BID) | ORAL | 0 refills | Status: DC

## 2021-05-24 NOTE — Telephone Encounter (Signed)
Chantix covered based on pharmacy. Send to her pharmacy.   Varenicline -- Varenicline (brand name: Chantix) is a prescription medication that works in the brain to reduce nicotine withdrawal symptoms and cigarette cravings. In several studies, it was more effective than placebo (a lookalike substitute that contains no medication.)  It should be taken after eating with a full glass of water as follows: ?One 0.5 mg tablet daily for three days ?One 0.5 mg tablet twice daily for the next four days ?One 1.0 mg tablet twice daily starting at day 7  You should plan to quit smoking between one and four weeks after starting varenicline.   You should continue it for 12 weeks before concluding if it is working; if you successfully quit at 12 weeks, you may continue taking it for an additional 12 weeks. If you have not quit after taking varenicline for 12 weeks, talk to your health care provider about the next step. Options include working harder to make behavioral changes and continuing varenicline or switching to another treatment.  Common side effects of varenicline include nausea and abnormal dreams.  In the very early years of Chantix there were some concerns about Chantix affecting mood and depression.  However, there has been lots of research on this and this is no longer a concern.  Chantix is considered safe to use even in patients taking medication for depression.   In 2011, the [place] Food and Drug Administration (FDA) issued an advisory that, in people who already have heart or blood vessel disease, varenicline may increase the risk of acute heart problems. If there is such a risk, it appears to be small, and is likely outweighed by the benefits of quitting smoking.

## 2021-06-14 ENCOUNTER — Ambulatory Visit: Payer: BC Managed Care – PPO | Admitting: Family Medicine

## 2021-07-08 ENCOUNTER — Encounter: Payer: Self-pay | Admitting: Family Medicine

## 2021-07-18 ENCOUNTER — Encounter: Payer: Self-pay | Admitting: Family Medicine

## 2021-07-18 ENCOUNTER — Ambulatory Visit (INDEPENDENT_AMBULATORY_CARE_PROVIDER_SITE_OTHER): Payer: BC Managed Care – PPO | Admitting: Family Medicine

## 2021-07-18 ENCOUNTER — Other Ambulatory Visit: Payer: Self-pay

## 2021-07-18 VITALS — BP 137/90 | HR 99 | Temp 98.4°F | Ht 61.0 in | Wt 173.0 lb

## 2021-07-18 DIAGNOSIS — E538 Deficiency of other specified B group vitamins: Secondary | ICD-10-CM

## 2021-07-18 DIAGNOSIS — Z720 Tobacco use: Secondary | ICD-10-CM | POA: Diagnosis not present

## 2021-07-18 DIAGNOSIS — Z0000 Encounter for general adult medical examination without abnormal findings: Secondary | ICD-10-CM | POA: Diagnosis not present

## 2021-07-18 DIAGNOSIS — Z114 Encounter for screening for human immunodeficiency virus [HIV]: Secondary | ICD-10-CM | POA: Diagnosis not present

## 2021-07-18 DIAGNOSIS — R5383 Other fatigue: Secondary | ICD-10-CM

## 2021-07-18 DIAGNOSIS — E559 Vitamin D deficiency, unspecified: Secondary | ICD-10-CM

## 2021-07-18 DIAGNOSIS — M79641 Pain in right hand: Secondary | ICD-10-CM

## 2021-07-18 DIAGNOSIS — F411 Generalized anxiety disorder: Secondary | ICD-10-CM

## 2021-07-18 DIAGNOSIS — Z23 Encounter for immunization: Secondary | ICD-10-CM | POA: Diagnosis not present

## 2021-07-18 DIAGNOSIS — Z1159 Encounter for screening for other viral diseases: Secondary | ICD-10-CM

## 2021-07-18 DIAGNOSIS — M79642 Pain in left hand: Secondary | ICD-10-CM

## 2021-07-18 DIAGNOSIS — E669 Obesity, unspecified: Secondary | ICD-10-CM

## 2021-07-18 DIAGNOSIS — Z0001 Encounter for general adult medical examination with abnormal findings: Secondary | ICD-10-CM | POA: Diagnosis not present

## 2021-07-18 DIAGNOSIS — I10 Essential (primary) hypertension: Secondary | ICD-10-CM

## 2021-07-18 DIAGNOSIS — J42 Unspecified chronic bronchitis: Secondary | ICD-10-CM | POA: Insufficient documentation

## 2021-07-18 MED ORDER — DICLOFENAC SODIUM 75 MG PO TBEC: 75.0000 mg | DELAYED_RELEASE_TABLET | Freq: Two times a day (BID) | ORAL | 2 refills | Status: DC | PRN

## 2021-07-18 MED ORDER — ESCITALOPRAM OXALATE 10 MG PO TABS: 10.0000 mg | ORAL_TABLET | Freq: Every day | ORAL | 1 refills | Status: DC

## 2021-07-18 NOTE — Progress Notes (Signed)
Assessment & Plan:  1. Well adult exam Preventive health education provided. Strongly encouraged patient to schedule a pap smear. - CBC with Differential/Platelet - CMP14+EGFR - Lipid panel  2. Bilateral hand pain Started diclofenac twice daily. Advised to avoid all other NSAIDs.  - CMP14+EGFR - diclofenac (VOLTAREN) 75 MG EC tablet; Take 1 tablet (75 mg total) by mouth 2 (two) times daily as needed.  Dispense: 60 tablet; Refill: 2  3. Chronic bronchitis, unspecified chronic bronchitis type [facility]) Managed by pulmonology.  4. Tobacco use Patient is trying to quit smoking with the help of her pulmonologist.  - CBC with Differential/Platelet - Lipid panel - Pneumococcal conjugate vaccine 20-valent (Prevnar 20)  5. Fatigue, unspecified type - CBC with Differential/Platelet - CMP14+EGFR - Lipid panel - VITAMIN D 25 Hydroxy (Vit-D Deficiency, Fractures) - Vitamin B12 - TSH  6. Vitamin B12 deficiency - Vitamin B12  7. Vitamin D deficiency - VITAMIN D 25 Hydroxy (Vit-D Deficiency, Fractures)  8. Essential hypertension Patient to monitor blood pressure at home and keep a log. Education provided on the DASH diet. Encouraged healthy eating and exercise to lower blood pressure without medication. - TSH  9. Generalized anxiety disorder Well controlled on current regimen.  - CMP14+EGFR - escitalopram (LEXAPRO) 10 MG tablet; Take 1 tablet (10 mg total) by mouth daily.  Dispense: 90 tablet; Refill: 1  10. Obesity (BMI 30.0-34.9) Encouraged healthy eating and exercise. - CBC with Differential/Platelet - CMP14+EGFR - Lipid panel  11. Encounter for hepatitis C screening test for low risk patient - Hepatitis C antibody, reflex  12. Encounter for screening for HIV - HIV Antibody (routine testing w rflx)  13. Immunization due - Tdap vaccine greater than or equal to 7yo IM - Pneumococcal conjugate vaccine 20-valent (Prevnar 20)   Follow-up: Return in about 3 months (around  10/16/2021) for HTN.   Hendricks Limes, MSN, APRN, FNP-C [facility]  Subjective:  Patient ID: Donna Bright, female    DOB: [birth year]  Age: 41 y.o. MRN: [id number]  Patient Care Team: Loman Brooklyn, FNP as PCP - General (Family Medicine)   CC:  Chief Complaint  Patient presents with   Annual Exam   Arthritis    Bilateral hands x 1 week     HPI Donna Bright presents for her annual physical. She is accompanied by her daughter's sister who she is okay with being present.   Occupation: Engineer, petroleum, Marital status: Divorced, Substance use: None Diet: Regular, Exercise: Walking at work Last eye exam: 3 years ago Last dental exam: a long time ago Last mammogram: at the 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 Hands are stiff in the morning. Hands make work painful. Pain extends up her arms. She does take Ibuprofen 400 mg which knocks the pain away for a little bit, but then it returns. She has also taken meloxicam in the past which she did not find very helpful. She states her hands also go numb at times.  Chronic Bronchitis: patient does see pulmonology, Dr. Shearon Stalls. She was last seen in November 2022. She reports she is going to have lung function testing in the near future. She has a Stiolto inhaler that she uses daily and Albuterol to use as needed. Her pulmonologist is trying to help her quit smoking. She has failed treatment with wellbutrin and currently does not like Chantix. She states the patches were  not effective for her either. Her next scheduled appointment is on 08/01/2021.  On chart review it appears patient has a history of a vitamin D and vitamin B12 deficiency. She is not currently taking any supplements.   Anxiety: patient reports she is doing well with Lexapro 10 mg daily.  GAD 7 : Generalized Anxiety Score 07/18/2021 05/15/2021  Nervous, Anxious, on Edge 0 0  Control/stop worrying 0 0  Worry too much - different things 0 0  Trouble relaxing 0 0  Restless 0 0  Easily annoyed or irritable 0 0  Afraid - awful might happen 0 0  Total GAD 7 Score 0 0  Anxiety Difficulty Not difficult at all -    Review of Systems  Constitutional:  Positive for malaise/fatigue. Negative for chills, fever and weight loss.  HENT:  Negative for congestion, ear discharge, ear pain, nosebleeds, sinus pain, sore throat and tinnitus.   Eyes:  Negative for blurred vision, double vision, pain, discharge and redness.  Respiratory:  Negative for cough, shortness of breath and wheezing.   Cardiovascular:  Negative for chest pain, palpitations and leg swelling.  Gastrointestinal:  Negative for abdominal pain, constipation, diarrhea, heartburn, nausea and vomiting.  Genitourinary:  Negative for dysuria, frequency and urgency.  Musculoskeletal:  Positive for joint pain. Negative for myalgias.  Skin:  Negative for rash.  Neurological:  Negative for dizziness, seizures, weakness and headaches.  Psychiatric/Behavioral:  Negative for depression, substance abuse and suicidal ideas. The patient is not nervous/anxious.     Current Outpatient Medications:    Albuterol Sulfate (PROAIR RESPICLICK) 701 (90 Base) MCG/ACT AEPB, Inhale 2 puffs into the lungs every 4 (four) hours as needed (shortness of breath)., Disp: 1 each, Rfl: 5   buPROPion (ZYBAN) 150 MG 12 hr tablet, Take 1 tablet (150 mg total) by mouth 2 (two) times daily., Disp: 60 tablet, Rfl: 5   escitalopram (LEXAPRO) 10 MG tablet, Take 10 mg by mouth daily.,  Disp: , Rfl:    Tiotropium Bromide-Olodaterol (STIOLTO RESPIMAT) 2.5-2.5 MCG/ACT AERS, Inhale 1 puff into the lungs daily., Disp: 1 each, Rfl: 5   varenicline (CHANTIX CONTINUING MONTH PAK) 1 MG tablet, Take 1 tablet (1 mg total) by mouth 2 (two) times daily., Disp: 60 tablet, Rfl: 5  Allergies  Allergen Reactions   Sulfamethoxazole-Trimethoprim Hives, Rash and Other (See Comments)    Past Medical History:  Diagnosis Date   Anxiety    History of chicken pox    Migraines    Pneumonia     Past Surgical History:  Procedure Laterality Date   CESAREAN SECTION      Family History  Problem Relation Age of Onset   Stroke Maternal Grandfather    Diabetes Maternal Grandfather    Emphysema Maternal Uncle     Social History   Socioeconomic History   Marital status: Legally Separated    Spouse name: Not on file   Number of children: Not on file   Years of education: Not on file   Highest education level: Not on file  Occupational History   Not on  file  Tobacco Use   Smoking status: Every Day    Packs/day: 1.00    Types: Cigarettes   Smokeless tobacco: Current  Substance and Sexual Activity   Alcohol use: No    Alcohol/week: 0.0 standard drinks   Drug use: No   Sexual activity: Never    Birth control/protection: None  Other Topics Concern   Not on file  Social History Narrative   Separated. 73 Child 41 year old female.    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    Wears seatbelts. Smoke alarm in the home.    Lives with roommate and child.    Social Determinants of Health   Financial Resource Strain: Not on file  Food Insecurity: Not on file  Transportation Needs: Not on file  Physical Activity: Not on file  Stress: Not on file  Social Connections: Not on file  Intimate Partner Violence: Not on file      Objective:    BP 137/90    Pulse 99    Temp 98.4 F (36.9 C) (Temporal)    Ht 5' 1"  (1.549 m)    Wt 173 lb (78.5 kg)     SpO2 95%    BMI 32.69 kg/m   Wt Readings from Last 3 Encounters:  07/18/21 173 lb (78.5 kg)  05/23/21 167 lb (75.8 kg)  05/15/21 167 lb (75.8 kg)    Physical Exam Vitals reviewed.  Constitutional:      General: She is not in acute distress.    Appearance: Normal appearance. She is obese. She is not ill-appearing, toxic-appearing or diaphoretic.  HENT:     Head: Normocephalic and atraumatic.     Right Ear: Tympanic membrane, ear canal and external ear normal. There is no impacted cerumen.     Left Ear: Tympanic membrane, ear canal and external ear normal. There is no impacted cerumen.     Nose: Nose normal. No congestion or rhinorrhea.     Mouth/Throat:     Mouth: Mucous membranes are moist.     Pharynx: Oropharynx is clear. No oropharyngeal exudate or posterior oropharyngeal erythema.  Eyes:     General: No scleral icterus.       Right eye: No discharge.        Left eye: No discharge.     Conjunctiva/sclera: Conjunctivae normal.     Pupils: Pupils are equal, round, and reactive to light.  Cardiovascular:     Rate and Rhythm: Normal rate and regular rhythm.     Heart sounds: Normal heart sounds. No murmur heard.   No friction rub. No gallop.  Pulmonary:     Effort: Pulmonary effort is normal. No respiratory distress.     Breath sounds: Normal breath sounds. No stridor. No wheezing, rhonchi or rales.  Abdominal:     General: Abdomen is flat. Bowel sounds are normal. There is no distension.     Palpations: Abdomen is soft. There is no hepatomegaly, splenomegaly or mass.     Tenderness: There is no abdominal tenderness. There is no guarding or rebound.     Hernia: No hernia is present.  Musculoskeletal:        General: Normal range of motion.     Cervical back: Normal range of motion and neck supple. No rigidity. No muscular tenderness.     Comments: Negative Tinel and Phalen's sign.  Lymphadenopathy:     Cervical: No cervical adenopathy.  Skin:    General: Skin is warm  and  dry.     Capillary Refill: Capillary refill takes less than 2 seconds.  Neurological:     General: No focal deficit present.     Mental Status: She is alert and oriented to person, place, and time. Mental status is at baseline.  Psychiatric:        Mood and Affect: Mood normal.        Behavior: Behavior normal.        Thought Content: Thought content normal.        Judgment: Judgment normal.    Lab Results  Component Value Date   TSH 1.02 04/27/2015   Lab Results  Component Value Date   WBC 8.7 05/05/2007   HGB 14.3 05/05/2007   HCT 42.0 05/05/2007   MCV 89.7 05/05/2007   PLT 262 05/05/2007   Lab Results  Component Value Date   NA 137 04/27/2015   K 4.1 04/27/2015   CO2 27 04/27/2015   GLUCOSE 115 (H) 04/27/2015   BUN 10 04/27/2015   CREATININE 0.75 04/27/2015   BILITOT 0.4 04/27/2015   ALKPHOS 69 04/27/2015   AST 16 04/27/2015   ALT 18 04/27/2015   PROT 7.6 04/27/2015   ALBUMIN 3.9 04/27/2015   CALCIUM 9.3 04/27/2015   GFR 93.72 04/27/2015   No results found for: CHOL No results found for: HDL No results found for: LDLCALC No results found for: TRIG No results found for: [facility] Lab Results  Component Value Date   HGBA1C 5.7 04/27/2015

## 2021-07-19 LAB — TSH: TSH: 0.979 u[IU]/mL (ref 0.450–4.500)

## 2021-07-19 LAB — CBC WITH DIFFERENTIAL/PLATELET
Basophils Absolute: 0.1 10*3/uL (ref 0.0–0.2)
Basos: 1 %
EOS (ABSOLUTE): 0.1 10*3/uL (ref 0.0–0.4)
Eos: 2 %
Hematocrit: 40.1 % (ref 34.0–46.6)
Hemoglobin: 13.3 g/dL (ref 11.1–15.9)
Immature Grans (Abs): 0 10*3/uL (ref 0.0–0.1)
Immature Granulocytes: 0 %
Lymphocytes Absolute: 1.7 10*3/uL (ref 0.7–3.1)
Lymphs: 21 %
MCH: 29 pg (ref 26.6–33.0)
MCHC: 33.2 g/dL (ref 31.5–35.7)
MCV: 87 fL (ref 79–97)
Monocytes Absolute: 0.5 10*3/uL (ref 0.1–0.9)
Monocytes: 6 %
Neutrophils Absolute: 5.6 10*3/uL (ref 1.4–7.0)
Neutrophils: 70 %
Platelets: 345 10*3/uL (ref 150–450)
RBC: 4.59 x10E6/uL (ref 3.77–5.28)
RDW: 14.2 % (ref 11.7–15.4)
WBC: 8 10*3/uL (ref 3.4–10.8)

## 2021-07-19 LAB — LIPID PANEL
Chol/HDL Ratio: 4.5 ratio — ABNORMAL HIGH (ref 0.0–4.4)
Cholesterol, Total: 175 mg/dL (ref 100–199)
HDL: 39 mg/dL — ABNORMAL LOW (ref >39)
LDL Chol Calc (NIH): 110 mg/dL — ABNORMAL HIGH (ref 0–99)
Triglycerides: 146 mg/dL (ref 0–149)
VLDL Cholesterol Cal: 26 mg/dL (ref 5–40)

## 2021-07-19 LAB — CMP14+EGFR
ALT: 16 IU/L (ref 0–32)
AST: 17 IU/L (ref 0–40)
Albumin/Globulin Ratio: 1.2 (ref 1.2–2.2)
Albumin: 4.1 g/dL (ref 3.8–4.8)
Alkaline Phosphatase: 94 IU/L (ref 44–121)
BUN/Creatinine Ratio: 16 (ref 9–23)
BUN: 12 mg/dL (ref 6–24)
Bilirubin Total: 0.2 mg/dL (ref 0.0–1.2)
CO2: 24 mmol/L (ref 20–29)
Calcium: 9.4 mg/dL (ref 8.7–10.2)
Chloride: 101 mmol/L (ref 96–106)
Creatinine, Ser: 0.75 mg/dL (ref 0.57–1.00)
Globulin, Total: 3.5 g/dL (ref 1.5–4.5)
Glucose: 109 mg/dL — ABNORMAL HIGH (ref 70–99)
Potassium: 4.5 mmol/L (ref 3.5–5.2)
Sodium: 137 mmol/L (ref 134–144)
Total Protein: 7.6 g/dL (ref 6.0–8.5)
eGFR: 103 mL/min/{1.73_m2} (ref >59)

## 2021-07-19 LAB — VITAMIN D 25 HYDROXY (VIT D DEFICIENCY, FRACTURES): Vit D, 25-Hydroxy: 26.7 ng/mL — ABNORMAL LOW (ref 30.0–100.0)

## 2021-07-19 LAB — HIV ANTIBODY (ROUTINE TESTING W REFLEX): HIV Screen 4th Generation wRfx: NONREACTIVE

## 2021-07-19 LAB — VITAMIN B12: Vitamin B-12: 276 pg/mL (ref 232–1245)

## 2021-07-21 ENCOUNTER — Encounter: Payer: Self-pay | Admitting: Family Medicine

## 2021-07-21 DIAGNOSIS — E669 Obesity, unspecified: Secondary | ICD-10-CM | POA: Insufficient documentation

## 2021-07-21 DIAGNOSIS — E66811 Obesity, class 1: Secondary | ICD-10-CM | POA: Insufficient documentation

## 2021-07-23 ENCOUNTER — Other Ambulatory Visit: Payer: Self-pay | Admitting: Family Medicine

## 2021-07-23 DIAGNOSIS — Z1231 Encounter for screening mammogram for malignant neoplasm of breast: Secondary | ICD-10-CM

## 2021-07-25 ENCOUNTER — Ambulatory Visit (INDEPENDENT_AMBULATORY_CARE_PROVIDER_SITE_OTHER): Payer: BC Managed Care – PPO | Admitting: Family Medicine

## 2021-07-25 ENCOUNTER — Encounter: Payer: Self-pay | Admitting: Family Medicine

## 2021-07-25 VITALS — BP 123/84 | HR 93 | Temp 98.3°F | Ht 61.0 in | Wt 173.4 lb

## 2021-07-25 DIAGNOSIS — I10 Essential (primary) hypertension: Secondary | ICD-10-CM | POA: Diagnosis not present

## 2021-07-25 NOTE — Progress Notes (Signed)
Assessment & Plan:  1. Essential hypertension - patient to bring cuff up to the office to compare against ours to see if hers is accurate, if found accurate will initiate lisinopril which was discussed with the patient and she is agreeable.   Return ASAP, for Nurse visit to compare home BP cuff to ours.  Lucile Crater, NP Student  I personally was present during the history, physical exam, and medical decision-making activities of this service and have verified that the service and findings are accurately documented in the nurse practitioner student's note.  Hendricks Limes, MSN, APRN, FNP-C [facility]  Subjective:    Patient ID: Donna Bright, female    DOB: 12-01-1980, 41 y.o.   MRN: [id number]  Patient Care Team: Loman Brooklyn, FNP as PCP - General (Family Medicine)   Chief Complaint:  Chief Complaint  Patient presents with   Hypertension    BP has been running high the last week. This morning it was 141/108.    HPI: Donna Bright is a 41 y.o. female presenting on 07/25/2021 for Hypertension (BP has been running high the last week. This morning it was 141/108.)  She is here today to follow up on hypertension. She is not currently on medication to treat, but was advised at her physical to monitor blood pressure and keep a log. She has been taking her BP at home up to 3 times a day and getting readings from 141-150/90-105, but when she is here she is normal if slightly elevated. She has previously been on HCTZ but stopped taking it because it made her feel sick. She states previously she was given a prescription for lisinopril by her previous PCP but never started it because he never discussed it with her and she was unsure about starting a medicine without discussing it. She does report headaches daily that are relived by ibuprofen, and she reports that she stays hydrated. She denies chest pain, dizziness, or changes in her vision.   New  complaints: None  Social history:  Relevant past medical, surgical, family and social history reviewed and updated as indicated. Interim medical history since our last visit reviewed.  Allergies and medications reviewed and updated.  DATA REVIEWED: CHART IN EPIC  ROS: Negative unless specifically indicated above in HPI.    Current Outpatient Medications:    Albuterol Sulfate (PROAIR RESPICLICK) 151 (90 Base) MCG/ACT AEPB, Inhale 2 puffs into the lungs every 4 (four) hours as needed (shortness of breath)., Disp: 1 each, Rfl: 5   diclofenac (VOLTAREN) 75 MG EC tablet, Take 1 tablet (75 mg total) by mouth 2 (two) times daily as needed., Disp: 60 tablet, Rfl: 2   escitalopram (LEXAPRO) 10 MG tablet, Take 1 tablet (10 mg total) by mouth daily., Disp: 90 tablet, Rfl: 1   Tiotropium Bromide-Olodaterol (STIOLTO RESPIMAT) 2.5-2.5 MCG/ACT AERS, Inhale 1 puff into the lungs daily., Disp: 1 each, Rfl: 5   Allergies  Allergen Reactions   Sulfamethoxazole-Trimethoprim Hives, Rash and Other (See Comments)   Past Medical History:  Diagnosis Date   Anxiety    Chronic bronchitis (HCC)    History of chicken pox    Hypertension    Migraines    Pneumonia    Vitamin D insufficiency     Past Surgical History:  Procedure Laterality Date   CESAREAN SECTION      Social History   Socioeconomic History   Marital status: Divorced    Spouse name: Not on file  Number of children: Not on file   Years of education: Not on file   Highest education level: Not on file  Occupational History   Not on file  Tobacco Use   Smoking status: Every Day    Packs/day: 1.00    Types: Cigarettes   Smokeless tobacco: Current  Substance and Sexual Activity   Alcohol use: No    Alcohol/week: 0.0 standard drinks   Drug use: No   Sexual activity: Never    Birth control/protection: None  Other Topics Concern   Not on file  Social History Narrative   Separated. 37 Child 41 year old female.    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    Wears seatbelts. Smoke alarm in the home.    Lives with roommate and child.    Social Determinants of Health   Financial Resource Strain: Not on file  Food Insecurity: Not on file  Transportation Needs: Not on file  Physical Activity: Not on file  Stress: Not on file  Social Connections: Not on file  Intimate Partner Violence: Not on file        Objective:    BP 123/84    Pulse 93    Temp 98.3 F (36.8 C) (Temporal)    Ht 5' 1"  (1.549 m)    Wt 78.7 kg    SpO2 97%    BMI 32.76 kg/m   Wt Readings from Last 3 Encounters:  07/25/21 173 lb 6.4 oz (78.7 kg)  07/18/21 173 lb (78.5 kg)  05/23/21 167 lb (75.8 kg)    Physical Exam Vitals reviewed.  Constitutional:      General: She is not in acute distress.    Appearance: Normal appearance. She is obese. She is not ill-appearing, toxic-appearing or diaphoretic.  HENT:     Head: Normocephalic and atraumatic.     Nose: Nose normal.     Mouth/Throat:     Mouth: Mucous membranes are moist.     Pharynx: Oropharynx is clear.  Eyes:     Extraocular Movements: Extraocular movements intact.     Conjunctiva/sclera: Conjunctivae normal.     Pupils: Pupils are equal, round, and reactive to light.  Cardiovascular:     Rate and Rhythm: Normal rate and regular rhythm.     Pulses: Normal pulses.     Heart sounds: Normal heart sounds.  Pulmonary:     Effort: Pulmonary effort is normal. No respiratory distress.  Abdominal:     General: There is no distension.     Palpations: Abdomen is soft. There is no mass.  Musculoskeletal:        General: Normal range of motion.     Cervical back: Normal range of motion.  Skin:    General: Skin is warm and dry.  Neurological:     General: No focal deficit present.     Mental Status: She is alert and oriented to person, place, and time.     Motor: No weakness.     Gait: Gait normal.  Psychiatric:        Mood and Affect: Mood  normal.        Behavior: Behavior normal.        Thought Content: Thought content normal.        Judgment: Judgment normal.    Lab Results  Component Value Date   TSH 0.979 07/18/2021   Lab Results  Component Value Date   WBC 8.0 07/18/2021   HGB  13.3 07/18/2021   HCT 40.1 07/18/2021   MCV 87 07/18/2021   PLT 345 07/18/2021   Lab Results  Component Value Date   NA 137 07/18/2021   K 4.5 07/18/2021   CO2 24 07/18/2021   GLUCOSE 109 (H) 07/18/2021   BUN 12 07/18/2021   CREATININE 0.75 07/18/2021   BILITOT 0.2 07/18/2021   ALKPHOS 94 07/18/2021   AST 17 07/18/2021   ALT 16 07/18/2021   PROT 7.6 07/18/2021   ALBUMIN 4.1 07/18/2021   CALCIUM 9.4 07/18/2021   EGFR 103 07/18/2021   GFR 93.72 04/27/2015   Lab Results  Component Value Date   CHOL 175 07/18/2021   Lab Results  Component Value Date   HDL 39 (L) 07/18/2021   Lab Results  Component Value Date   LDLCALC 110 (H) 07/18/2021   Lab Results  Component Value Date   TRIG 146 07/18/2021   Lab Results  Component Value Date   CHOLHDL 4.5 (H) 07/18/2021   Lab Results  Component Value Date   HGBA1C 5.7 04/27/2015

## 2021-07-31 ENCOUNTER — Other Ambulatory Visit: Payer: Self-pay | Admitting: Internal Medicine

## 2021-07-31 DIAGNOSIS — J42 Unspecified chronic bronchitis: Secondary | ICD-10-CM

## 2021-08-01 ENCOUNTER — Ambulatory Visit (INDEPENDENT_AMBULATORY_CARE_PROVIDER_SITE_OTHER): Payer: BC Managed Care – PPO | Admitting: Internal Medicine

## 2021-08-01 ENCOUNTER — Other Ambulatory Visit: Payer: Self-pay

## 2021-08-01 ENCOUNTER — Encounter: Payer: Self-pay | Admitting: Internal Medicine

## 2021-08-01 VITALS — BP 108/74 | HR 88 | Temp 97.6°F | Ht 62.5 in | Wt 172.2 lb

## 2021-08-01 DIAGNOSIS — F17210 Nicotine dependence, cigarettes, uncomplicated: Secondary | ICD-10-CM

## 2021-08-01 DIAGNOSIS — Z716 Tobacco abuse counseling: Secondary | ICD-10-CM

## 2021-08-01 DIAGNOSIS — J449 Chronic obstructive pulmonary disease, unspecified: Secondary | ICD-10-CM | POA: Diagnosis not present

## 2021-08-01 DIAGNOSIS — J42 Unspecified chronic bronchitis: Secondary | ICD-10-CM | POA: Diagnosis not present

## 2021-08-01 LAB — PULMONARY FUNCTION TEST
DL/VA % pred: 89 %
DL/VA: 4.02 ml/min/mmHg/L
DLCO cor % pred: 70 %
DLCO cor: 14.58 ml/min/mmHg
DLCO unc % pred: 69 %
DLCO unc: 14.54 ml/min/mmHg
FEF 25-75 Post: 1.96 L/sec
FEF 25-75 Pre: 2.3 L/sec
FEF2575-%Change-Post: -14 %
FEF2575-%Pred-Post: 64 %
FEF2575-%Pred-Pre: 75 %
FEV1-%Change-Post: -4 %
FEV1-%Pred-Post: 70 %
FEV1-%Pred-Pre: 73 %
FEV1-Post: 2.04 L
FEV1-Pre: 2.12 L
FEV1FVC-%Change-Post: 1 %
FEV1FVC-%Pred-Pre: 100 %
FEV6-%Change-Post: -5 %
FEV6-%Pred-Post: 69 %
FEV6-%Pred-Pre: 74 %
FEV6-Post: 2.41 L
FEV6-Pre: 2.56 L
FEV6FVC-%Pred-Post: 101 %
FEV6FVC-%Pred-Pre: 101 %
FVC-%Change-Post: -5 %
FVC-%Pred-Post: 68 %
FVC-%Pred-Pre: 72 %
FVC-Post: 2.41 L
FVC-Pre: 2.56 L
Post FEV1/FVC ratio: 84 %
Post FEV6/FVC ratio: 100 %
Pre FEV1/FVC ratio: 83 %
Pre FEV6/FVC Ratio: 100 %
RV % pred: 113 %
RV: 1.72 L
TLC % pred: 87 %
TLC: 4.23 L

## 2021-08-01 NOTE — Patient Instructions (Signed)
Please schedule follow up scheduled with myself in 6 months.  If my schedule is not open yet, we will contact you with a reminder closer to that time. Please call [phone] if you haven't heard from [place] a month before.   Continue Chantix Continue Stiolto and albuterol as needed.   Call me if any questions or concerns prior to next appointment.  What are the benefits of quitting smoking? Quitting smoking can lower your chances of getting or dying from heart disease, lung disease, kidney failure, infection, or cancer. It can also lower your chances of getting osteoporosis, a condition that makes your bones weak. Plus, quitting smoking can help your skin look younger and reduce the chances that you will have problems with sex.  Quitting smoking will improve your health no matter how old you are, and no matter how long or how much you have smoked.  What should I do if I want to quit smoking? The letters in the word "START" can help you remember the steps to take: S = Set a quit date. T = Tell family, friends, and the people around you that you plan to quit. A = Anticipate or plan ahead for the tough times you'll face while quitting. R = Remove cigarettes and other tobacco products from your home, car, and work. T = Talk to your doctor about getting help to quit.  How can my doctor or nurse help? Your doctor or nurse can give you advice on the best way to quit. He or she can also put you in touch with counselors or other people you can call for support. Plus, your doctor or nurse can give you medicines to: ?Reduce your craving for cigarettes ?Reduce the unpleasant symptoms that happen when you stop smoking (called "withdrawal symptoms"). You can also get help from a free phone line (1-800-QUIT-NOW) or go online to [employer].  What are the symptoms of withdrawal? The symptoms include: ?Trouble sleeping ?Being irritable, anxious or restless ?Getting frustrated or angry ?Having  trouble thinking clearly  Some people who stop smoking become temporarily depressed. Some people need treatment for depression, such as counseling or antidepressant medicines. Depressed people might: ?No longer enjoy or care about doing the things they used to like to do ?Feel sad, down, hopeless, nervous, or cranky most of the day, almost every day ?Lose or gain weight ?Sleep too much or too little ?Feel tired or like they have no energy ?Feel guilty or like they are worth nothing ?Forget things or feel confused ?Move and speak more slowly than usual ?Act restless or have trouble staying still ?Think about death or suicide  If you think you might be depressed, see your doctor or nurse. Only someone trained in mental health can tell for sure if you are depressed. If you ever feel like you might hurt yourself, go straight to the nearest emergency department. Or you can call for an ambulance (in the [place] and [place], dial 9-1-1) or call your doctor or nurse right away and tell them it is an emergency. You can also reach the [place] National Suicide Prevention Lifeline at (347)452-0871 or http://hill.com/.  How do medicines help you stop smoking? Different medicines work in different ways: ?Nicotine replacement therapy eases withdrawal and reduces your body's craving for nicotine, the main drug found in cigarettes. There are different forms of nicotine replacement, including skin patches, lozenges, gum, nasal sprays, and "puffers" or inhalers. Many can be bought without a prescription, while others might require one. ?  Bupropion is a prescription medicine that reduces your desire to smoke. This medicine is sold under the brand names Zyban and Wellbutrin. It is also available in a generic version, which is cheaper than brand name medicines. ?Varenicline (brand names: Chantix, Champix) is a prescription medicine that reduces withdrawal symptoms and cigarette cravings. If you think you'd like  to take varenicline and you have a history of depression, anxiety, or heart disease, discuss this with your doctor or nurse before taking the medicine. Varenicline can also increase the effects of alcohol in some people. It's a good idea to limit drinking while you're taking it, at least until you know how it affects you.  How does counseling work? Counseling can happen during formal office visits or just over the phone. A counselor can help you: ?Figure out what triggers your smoking and what to do instead ?Overcome cravings ?Figure out what went wrong when you tried to quit before  What works best? Studies show that people have the best luck at quitting if they take medicines to help them quit and work with a Veterinary surgeon. It might also be helpful to combine nicotine replacement with one of the prescription medicines that help people quit. In some cases, it might even make sense to take bupropion and varenicline together.  What about e-cigarettes? Sometimes people wonder if using electronic cigarettes, or "e-cigarettes," might help them quit smoking. Using e-cigarettes is also called "vaping." Doctors do not recommend e-cigarettes in place of medicines and counseling. That's because e-cigarettes still contain nicotine as well as other substances that might be harmful. It's not clear how they can affect a person's health in the long term.  Will I gain weight if I quit? Yes, you might gain a few pounds. But quitting smoking will have a much more positive effect on your health than weighing a few pounds more. Plus, you can help prevent some weight gain by being more active and eating less. Taking the medicine bupropion might help control weight gain.   What else can I do to improve my chances of quitting? You can: ?Start exercising. ?Stay away from smokers and places that you associate with smoking. If people close to you smoke, ask them to quit with you. ?Keep gum, hard candy, or something to put  in your mouth handy. If you get a craving for a cigarette, try one of these instead. ?Don't give up, even if you start smoking again. It takes most people a few tries before they succeed.  What if I am pregnant and I smoke? If you are pregnant, it's really important for the health of your baby that you quit. Ask your doctor what options you have, and what is safest for your baby

## 2021-08-01 NOTE — Progress Notes (Signed)
PFT done today. 

## 2021-08-01 NOTE — Progress Notes (Signed)
Donna Bright    [id number]    [birth year]  Primary Care Physician:Joyce, Robin Searing, FNP Date of Appointment: 08/01/2021 Established Patient Visit  Chief complaint:   Chief Complaint  Patient presents with   Follow-up    She is here for a PFT review. She feels that she is doing well with her Stiolto.      HPI: Donna Bright is a 41 y.o. woman with everyday 1 ppd smoking use.   Interval Updates: Here for follow up after PFTS.   No exacerbations of COPD  in the interim.   Stopped wellbutrin due to nausea. Took it for about a week. Chantix makes her hands hurt.   Albuterol use is 2-3 times a week.   Previously smoking 1 ppd but trying to quit. Has cut back by not smoking the entire cigarette and allowing it to burn.   Feeling like the craving is going down and is excited to quit.   I have reviewed the patient's family social and past medical history and updated as appropriate.   Past Medical History:  Diagnosis Date   Anxiety    Chronic bronchitis (HCC)    History of chicken pox    Hypertension    Migraines    Pneumonia    Vitamin D insufficiency     Past Surgical History:  Procedure Laterality Date   CESAREAN SECTION      Family History  Problem Relation Age of Onset   Stroke Maternal Grandfather    Diabetes Maternal Grandfather    Emphysema Maternal Uncle     Social History   Occupational History   Not on file  Tobacco Use   Smoking status: Some Days    Packs/day: 0.50    Types: Cigarettes   Smokeless tobacco: Former  Substance and Sexual Activity   Alcohol use: No    Alcohol/week: 0.0 standard drinks   Drug use: No   Sexual activity: Never    Birth control/protection: None     Physical Exam: Blood pressure 108/74, pulse 88, temperature 97.6 F (36.4 C), temperature source Oral, height 5' 2.5" (1.588 m), weight 172 lb 3.2 oz (78.1 kg), SpO2 98 %.  Gen:      No acute distress Lungs:    No increased respiratory effort,  symmetric chest wall excursion, clear to auscultation bilaterally, no wheezes or crackles CV:         Regular rate and rhythm; no murmurs, rubs, or gallops.  No pedal edema   Data Reviewed: Imaging: I have personally reviewed the chest xray Nov 2022 - no acute cardiopulmonary process  PFTs:  PFT Results Latest Ref Rng & Units 08/01/2021  FVC-Pre L 2.56  FVC-Predicted Pre % 72  FVC-Post L 2.41  FVC-Predicted Post % 68  Pre FEV1/FVC % % 83  Post FEV1/FCV % % 84  FEV1-Pre L 2.12  FEV1-Predicted Pre % 73  FEV1-Post L 2.04  DLCO uncorrected ml/min/mmHg 14.54  DLCO UNC% % 69  DLCO corrected ml/min/mmHg 14.58  DLCO COR %Predicted % 70  DLVA Predicted % 89  TLC L 4.23  TLC % Predicted % 87  RV % Predicted % 113   I have personally reviewed the patient's PFTs and normal pulmonary function.   Labs:  Immunization status: Immunization History  Administered Date(s) Administered   PFIZER(Purple Top)SARS-COV-2 Vaccination 09/15/2019, 10/06/2019   PNEUMOCOCCAL CONJUGATE-20 07/18/2021   Tdap 05/26/2010, 07/18/2021     Assessment:  COPD, Gold stage  0. Improved control Encounter for smoking cessation  Plan/Recommendations:  Continue Stiolto with prn albuterol Smoking cessation. Set a quit date. We discussed at length risks and benefits of chantix including that all medications have side effects, and that I expect the side effects to lessen after the first two weeks. She is in agreement to resume chantix for now.    Smoking Cessation Counseling:  1. The patient is an everyday smoker and symptomatic due to the following condition COPD 2. The patient is currently contemplative in quitting smoking. 3. I advised patient to quit smoking. 4. We identified patient specific barriers to change.  5. I personally spent 3 minutes counseling the patient regarding tobacco use disorder. 6. We discussed management of stress and anxiety to help with smoking cessation, when applicable. 7. We  discussed nicotine replacement therapy, Wellbutrin, Chantix as possible options. 8. I advised setting a quit date. 9. Follow?up arranged with our office to continue ongoing discussions. 10.Resources given to patient including quit hotline.    Return to Care: Return in about 6 months (around 01/29/2022).   Durel Salts, MD Pulmonary and Critical Care Medicine [facility] Office:[phone]

## 2021-08-12 ENCOUNTER — Ambulatory Visit
Admission: RE | Admit: 2021-08-12 | Discharge: 2021-08-12 | Disposition: A | Discharge status: Home / Self Care | Payer: BC Managed Care – PPO | Source: Ambulatory Visit | Attending: Family Medicine | Admitting: Family Medicine

## 2021-08-12 DIAGNOSIS — Z1231 Encounter for screening mammogram for malignant neoplasm of breast: Secondary | ICD-10-CM

## 2021-08-19 ENCOUNTER — Ambulatory Visit: Payer: BC Managed Care – PPO | Admitting: Orthopedic Surgery

## 2021-10-17 ENCOUNTER — Ambulatory Visit: Payer: BC Managed Care – PPO

## 2021-10-17 ENCOUNTER — Ambulatory Visit: Payer: BC Managed Care – PPO | Admitting: Family Medicine

## 2021-12-27 DIAGNOSIS — M5441 Lumbago with sciatica, right side: Secondary | ICD-10-CM | POA: Diagnosis not present

## 2021-12-27 DIAGNOSIS — M25511 Pain in right shoulder: Secondary | ICD-10-CM | POA: Diagnosis not present

## 2021-12-27 DIAGNOSIS — Z6831 Body mass index (BMI) 31.0-31.9, adult: Secondary | ICD-10-CM | POA: Diagnosis not present

## 2022-01-02 DIAGNOSIS — M545 Low back pain, unspecified: Secondary | ICD-10-CM | POA: Diagnosis not present

## 2022-01-13 DIAGNOSIS — M5416 Radiculopathy, lumbar region: Secondary | ICD-10-CM | POA: Diagnosis not present

## 2022-01-16 ENCOUNTER — Other Ambulatory Visit: Payer: Self-pay | Admitting: Family Medicine

## 2022-01-16 DIAGNOSIS — F411 Generalized anxiety disorder: Secondary | ICD-10-CM

## 2022-01-17 DIAGNOSIS — M5451 Vertebrogenic low back pain: Secondary | ICD-10-CM | POA: Diagnosis not present

## 2022-01-22 DIAGNOSIS — Z6832 Body mass index (BMI) 32.0-32.9, adult: Secondary | ICD-10-CM | POA: Diagnosis not present

## 2022-01-22 DIAGNOSIS — K0889 Other specified disorders of teeth and supporting structures: Secondary | ICD-10-CM | POA: Diagnosis not present

## 2022-02-17 ENCOUNTER — Other Ambulatory Visit: Payer: Self-pay | Admitting: Specialist

## 2022-02-17 DIAGNOSIS — M5451 Vertebrogenic low back pain: Secondary | ICD-10-CM

## 2022-02-24 ENCOUNTER — Ambulatory Visit
Admission: RE | Admit: 2022-02-24 | Discharge: 2022-02-24 | Disposition: A | Discharge status: Home / Self Care | Payer: BC Managed Care – PPO | Source: Ambulatory Visit | Attending: Specialist | Admitting: Specialist

## 2022-02-24 DIAGNOSIS — M5451 Vertebrogenic low back pain: Secondary | ICD-10-CM

## 2022-02-24 DIAGNOSIS — M4727 Other spondylosis with radiculopathy, lumbosacral region: Secondary | ICD-10-CM | POA: Diagnosis not present

## 2022-02-24 DIAGNOSIS — M48061 Spinal stenosis, lumbar region without neurogenic claudication: Secondary | ICD-10-CM | POA: Diagnosis not present

## 2022-02-24 MED ORDER — METHYLPREDNISOLONE ACETATE 40 MG/ML INJ SUSP (RADIOLOG
80.0000 mg | Freq: Once | INTRAMUSCULAR | Status: AC
  Administered 2022-02-24: 80 mg via EPIDURAL

## 2022-02-24 MED ORDER — IOPAMIDOL (ISOVUE-M 200) INJECTION 41%
1.0000 mL | Freq: Once | INTRAMUSCULAR | Status: AC
  Administered 2022-02-24: 1 mL via EPIDURAL

## 2022-02-24 NOTE — Discharge Instructions (Signed)

## 2022-03-07 ENCOUNTER — Other Ambulatory Visit: Payer: Self-pay | Admitting: Family Medicine

## 2022-03-07 DIAGNOSIS — F411 Generalized anxiety disorder: Secondary | ICD-10-CM

## 2022-03-17 DIAGNOSIS — M5451 Vertebrogenic low back pain: Secondary | ICD-10-CM | POA: Diagnosis not present

## 2022-03-18 ENCOUNTER — Other Ambulatory Visit: Payer: Self-pay

## 2022-03-18 ENCOUNTER — Ambulatory Visit: Payer: BC Managed Care – PPO | Attending: Specialist

## 2022-03-18 DIAGNOSIS — M5416 Radiculopathy, lumbar region: Secondary | ICD-10-CM

## 2022-03-18 DIAGNOSIS — M6281 Muscle weakness (generalized): Secondary | ICD-10-CM

## 2022-03-18 NOTE — Therapy (Signed)
OUTPATIENT PHYSICAL THERAPY THORACOLUMBAR EVALUATION   Patient Name: Donna Bright MRN: [id number] DOB:[birth year], 41 y.o., female Today's Date: 03/18/2022   PT End of Session - 03/18/22 0941     Visit Number 1    Number of Visits 8    Date for PT Re-Evaluation 04/18/22    PT Start Time 0948    PT Stop Time 1028    PT Time Calculation (min) 40 min    Activity Tolerance Patient tolerated treatment well    Behavior During Therapy [facility] for tasks assessed/performed             Past Medical History:  Diagnosis Date   Anxiety    Chronic bronchitis (HCC)    History of chicken pox    Hypertension    Migraines    Pneumonia    Vitamin D insufficiency    Past Surgical History:  Procedure Laterality Date   CESAREAN SECTION     Patient Active Problem List   Diagnosis Date Noted   Obesity (BMI 30.0-34.9) 07/21/2021   Essential hypertension 07/18/2021   Chronic bronchitis, unspecified chronic bronchitis type (HCC) 07/18/2021   Generalized anxiety disorder 07/18/2021   Vitamin D insufficiency 04/30/2015   Tobacco use 04/27/2015   REFERRING PROVIDER: Jene Every, MD  REFERRING DIAG: Low back pain, unspecified  Rationale for Evaluation and Treatment Rehabilitation  THERAPY DIAG:  Radiculopathy, lumbar region  Muscle weakness (generalized)  ONSET DATE: about 6 months ago  SUBJECTIVE:                                                                                                                                                                                           SUBJECTIVE STATEMENT: Patient reports that she has been having back pain and numbness in her right leg. She thinks that her pain was caused from having to lift a queen sized mattress at work. She notes that her pain has been fairly steady since it first began. She is had an injection which helped for a few weeks, but her pain returned. She is currently being held out of work until she is able to get a second  injection. She was told that she will need surgery if the injection or therapy does not work. She has never had any pain like this happen before.  PERTINENT HISTORY:  HTN, smoker  PAIN:  Are you having pain? Yes: NPRS scale: 8/10 Pain location: low back and right lower extremity Pain description: constant numbness, sharp, ache (throughout RLE),  Aggravating factors: standing  Relieving factors: prednisone, medication, ice   PRECAUTIONS: None  WEIGHT BEARING RESTRICTIONS No  FALLS:  Has  patient fallen in last 6 months? No and has felt unsteady since her leg has gone numb  LIVING ENVIRONMENT: Lives with: lives with their family Lives in: House/apartment Stairs: No Has following equipment at home: None  OCCUPATION: currently out of work; job requirements: standing for 8-9 hour shift at [employer], lifting,   PLOF: Independent  PATIENT GOALS reduced pain, return to work, and be able to help her mother care for her father    OBJECTIVE:   SCREENING FOR RED FLAGS: Bowel or bladder incontinence: Yes: she has noticed some bladder changes, but she talked to her referring physician Spinal tumors: No Cauda equina syndrome: No Compression fracture: No Abdominal aneurysm: No  COGNITION:  Overall cognitive status: Within functional limits for tasks assessed     SENSATION: Patient reports constant numbness throughout RLE. Diminished sensation along L1-3 dermatomes of the RLE  POSTURE: rounded shoulders and weight shift right  PALPATION: TTP: bilateral lumbar paraspinals (R>L), QL (R>L), and right gluteals  JOINT MOBILITY:   Lumbar: hypomobile with L3-5 being painful  LUMBAR ROM:   Active  A/PROM  eval  Flexion 32  Extension 14  Right lateral flexion 25% limited with familiar low back pain  Left lateral flexion 25% limited  Right rotation 25% limited with familiar low back pain  Left rotation 25% limited   (Blank rows = not tested)  LOWER EXTREMITY ROM: WFL for  activities assessed    LOWER EXTREMITY MMT:    MMT Right eval Left eval  Hip flexion 4-/5 4/5  Hip extension    Hip abduction    Hip adduction    Hip internal rotation    Hip external rotation    Knee flexion 4-/5 with familiar low back pain 4-/5  Knee extension 4-/5 with familiar low back pain 4/5 with familiar low back pain  Ankle dorsiflexion 4-/5 4/5  Ankle plantarflexion    Ankle inversion    Ankle eversion     (Blank rows = not tested)  GAIT: Assistive device utilized: None Level of assistance: Complete Independence Comments: reduced gait speed and stride length  TODAY'S TREATMENT  Modalities  Date:  Cold Pack: Lumbar, 10 mins, Pain  PATIENT EDUCATION:  Education details: POC, prognosis, healing Person educated: Patient Education method: Explanation Education comprehension: verbalized understanding   HOME EXERCISE PROGRAM:   ASSESSMENT:  CLINICAL IMPRESSION: Patient is a 41 y.o. female who was seen today for physical therapy evaluation and treatment for right lumbar radiculopathy. She presented with moderate to high pain severity and irritability with lumbar AROM and lower extremity strength assessments reproducing her familiar low back pain. She also exhibited reduced sensation to light touch along her right lower extremity at the L1-3 dermatomes. Recommend that she continue with skilled physical therapy to address her remaining impairments to maximize her functional mobility.    OBJECTIVE IMPAIRMENTS Abnormal gait, decreased activity tolerance, decreased mobility, difficulty walking, decreased ROM, decreased strength, hypomobility, impaired sensation, impaired tone, postural dysfunction, and pain.   ACTIVITY LIMITATIONS lifting, bending, standing, transfers, locomotion level, and caring for others  PARTICIPATION LIMITATIONS: cleaning, shopping, community activity, and occupation  PERSONAL FACTORS Time since onset of injury/illness/exacerbation and 1-2  comorbidities: HTN and current smoker  are also affecting patient's functional outcome.   REHAB POTENTIAL: Fair    CLINICAL DECISION MAKING: Evolving/moderate complexity  EVALUATION COMPLEXITY: Moderate   GOALS: Goals reviewed with patient? No  LONG TERM GOALS: Target date: 04/15/2022  Patient will be independent with her HEP.  Baseline:  Goal status:  INITIAL  2.  Patient will be able to complete her daily activities without her familiar pain exceeding 5/10. Baseline:  Goal status: INITIAL  3.  Patient will be able to lift at least 10 pounds without being limited by her familiar back pain.  Baseline: unable to lift more than 5 pounds currently Goal status: INITIAL  4.  Patient will be able demonstrate her right lower extremity manual muscle testing at least equal to or greater than the left lower extremity.  Baseline:  Goal status: INITIAL  PLAN: PT FREQUENCY: 2x/week  PT DURATION: 4 weeks  PLANNED INTERVENTIONS: Therapeutic exercises, Therapeutic activity, Neuromuscular re-education, Balance training, Gait training, Patient/Family education, Self Care, Joint mobilization, Stair training, Dry Needling, Electrical stimulation, Spinal mobilization, Cryotherapy, Moist heat, Taping, Traction, Manual therapy, and Re-evaluation.  PLAN FOR NEXT SESSION: nustep, lumbar stabilization, and modalities as needed   Granville Lewis, PT 03/18/2022, 12:53 PM

## 2022-03-20 ENCOUNTER — Encounter: Payer: Self-pay | Admitting: *Deleted

## 2022-03-20 ENCOUNTER — Ambulatory Visit: Payer: BC Managed Care – PPO | Admitting: *Deleted

## 2022-03-20 DIAGNOSIS — M6281 Muscle weakness (generalized): Secondary | ICD-10-CM | POA: Diagnosis not present

## 2022-03-20 DIAGNOSIS — M5416 Radiculopathy, lumbar region: Secondary | ICD-10-CM

## 2022-03-20 NOTE — Therapy (Signed)
OUTPATIENT PHYSICAL THERAPY THORACOLUMBAR EVALUATION   Patient Name: Donna Bright MRN: [id number] DOB:04/18/1981, 41 y.o., female Today's Date: 03/20/2022   PT End of Session - 03/20/22 1514     Visit Number 2    Number of Visits 8    Date for PT Re-Evaluation 04/18/22    PT Start Time 1515    PT Stop Time 1605    PT Time Calculation (min) 50 min             Past Medical History:  Diagnosis Date   Anxiety    Chronic bronchitis (HCC)    History of chicken pox    Hypertension    Migraines    Pneumonia    Vitamin D insufficiency    Past Surgical History:  Procedure Laterality Date   CESAREAN SECTION     Patient Active Problem List   Diagnosis Date Noted   Obesity (BMI 30.0-34.9) 07/21/2021   Essential hypertension 07/18/2021   Chronic bronchitis, unspecified chronic bronchitis type (HCC) 07/18/2021   Generalized anxiety disorder 07/18/2021   Vitamin D insufficiency 04/30/2015   Tobacco use 04/27/2015   REFERRING PROVIDER: Jene Every, MD  REFERRING DIAG: Low back pain, unspecified  Rationale for Evaluation and Treatment Rehabilitation  THERAPY DIAG:  Radiculopathy, lumbar region  Muscle weakness (generalized)  ONSET DATE: about 6 months ago  SUBJECTIVE:                                                                                                                                                                                           SUBJECTIVE STATEMENT:     Pt reports pain is about the same. Pain all night in LB PERTINENT HISTORY:  HTN, smoker  PAIN:  Are you having pain? Yes: NPRS scale: 8/10 Pain location: low back and right lower extremity Pain description: constant numbness, sharp, ache (throughout RLE),  Aggravating factors: standing  Relieving factors: prednisone, medication, ice   PRECAUTIONS: None  WEIGHT BEARING RESTRICTIONS No  FALLS:  Has patient fallen in last 6 months? No and has felt unsteady since her leg has gone  numb  LIVING ENVIRONMENT: Lives with: lives with their family Lives in: House/apartment Stairs: No Has following equipment at home: None  OCCUPATION: currently out of work; job requirements: standing for 8-9 hour shift at [employer], lifting,   PLOF: Independent  PATIENT GOALS reduced pain, return to work, and be able to help her mother care for her father    OBJECTIVE:   SCREENING FOR RED FLAGS: Bowel or bladder incontinence: Yes: she has noticed some bladder changes, but she talked to her referring physician Spinal tumors: No  Cauda equina syndrome: No Compression fracture: No Abdominal aneurysm: No  COGNITION:  Overall cognitive status: Within functional limits for tasks assessed     SENSATION: Patient reports constant numbness throughout RLE. Diminished sensation along L1-3 dermatomes of the RLE  POSTURE: rounded shoulders and weight shift right  PALPATION: TTP: bilateral lumbar paraspinals (R>L), QL (R>L), and right gluteals  JOINT MOBILITY:   Lumbar: hypomobile with L3-5 being painful  LUMBAR ROM:   Active  A/PROM  eval  Flexion 32  Extension 14  Right lateral flexion 25% limited with familiar low back pain  Left lateral flexion 25% limited  Right rotation 25% limited with familiar low back pain  Left rotation 25% limited   (Blank rows = not tested)  LOWER EXTREMITY ROM: WFL for activities assessed    LOWER EXTREMITY MMT:    MMT Right eval Left eval  Hip flexion 4-/5 4/5  Hip extension    Hip abduction    Hip adduction    Hip internal rotation    Hip external rotation    Knee flexion 4-/5 with familiar low back pain 4-/5  Knee extension 4-/5 with familiar low back pain 4/5 with familiar low back pain  Ankle dorsiflexion 4-/5 4/5  Ankle plantarflexion    Ankle inversion    Ankle eversion     (Blank rows = not tested)  GAIT: Assistive device utilized: None Level of assistance: Complete Independence Comments: reduced gait speed and stride  length  TODAY'S TREATMENT   03-20-22                                     EXERCISE LOG  Exercise Repetitions and Resistance Comments  Nustep L3 x 12 mins   AB bracing X10 hold 5 secs   Lying prone X 5 mins            Blank cell = exercise not performed today  Therapeutic Activities:      Pt instructed in AB bracing for transitional movements and for lifting. Log roll was practiced for in/out of bed and for bed mobility. Movement patterns practiced/ demonstrated for ADL's such as donning socks/shoes, laundry and washing dishes. Handouts given for posture and AB bracing as well as prone position     Modalities  Date:    PATIENT EDUCATION:  Education details: POC, prognosis, healing Person educated: Patient Education method: Explanation Education comprehension: verbalized understanding   HOME EXERCISE PROGRAM:   ASSESSMENT:  CLINICAL IMPRESSION: Pt arrived today doing about the same as far as LBP. Rx focused on AB bracing and functional movement patterns for ADL's to prevent pain triggers as well as lying prone for pain relief. Handouts given for postures and exs and Pt reports decreased pain end of session.  OBJECTIVE IMPAIRMENTS Abnormal gait, decreased activity tolerance, decreased mobility, difficulty walking, decreased ROM, decreased strength, hypomobility, impaired sensation, impaired tone, postural dysfunction, and pain.   ACTIVITY LIMITATIONS lifting, bending, standing, transfers, locomotion level, and caring for others  PARTICIPATION LIMITATIONS: cleaning, shopping, community activity, and occupation  PERSONAL FACTORS Time since onset of injury/illness/exacerbation and 1-2 comorbidities: HTN and current smoker  are also affecting patient's functional outcome.   REHAB POTENTIAL: Fair    CLINICAL DECISION MAKING: Evolving/moderate complexity  EVALUATION COMPLEXITY: Moderate   GOALS: Goals reviewed with patient? No  LONG TERM GOALS: Target date:  04/15/2022  Patient will be independent with her HEP.  Baseline:  Goal  status: INITIAL  2.  Patient will be able to complete her daily activities without her familiar pain exceeding 5/10. Baseline:  Goal status: INITIAL  3.  Patient will be able to lift at least 10 pounds without being limited by her familiar back pain.  Baseline: unable to lift more than 5 pounds currently Goal status: INITIAL  4.  Patient will be able demonstrate her right lower extremity manual muscle testing at least equal to or greater than the left lower extremity.  Baseline:  Goal status: INITIAL  PLAN: PT FREQUENCY: 2x/week  PT DURATION: 4 weeks  PLANNED INTERVENTIONS: Therapeutic exercises, Therapeutic activity, Neuromuscular re-education, Balance training, Gait training, Patient/Family education, Self Care, Joint mobilization, Stair training, Dry Needling, Electrical stimulation, Spinal mobilization, Cryotherapy, Moist heat, Taping, Traction, Manual therapy, and Re-evaluation.  PLAN FOR NEXT SESSION: nustep, lumbar stabilization, and modalities as needed   Nassir Neidert,CHRIS, PTA 03/20/2022, 4:49 PM

## 2022-03-26 ENCOUNTER — Ambulatory Visit: Payer: BC Managed Care – PPO | Admitting: Physical Therapy

## 2022-03-26 ENCOUNTER — Encounter: Payer: Self-pay | Admitting: Physical Therapy

## 2022-03-26 DIAGNOSIS — M6281 Muscle weakness (generalized): Secondary | ICD-10-CM

## 2022-03-26 DIAGNOSIS — M5416 Radiculopathy, lumbar region: Secondary | ICD-10-CM

## 2022-03-26 NOTE — Therapy (Signed)
OUTPATIENT PHYSICAL THERAPY THORACOLUMBAR TREATMENT   Patient Name: Donna Bright MRN: [id number] DOB:11/09/1980, 41 y.o., female Today's Date: 03/26/2022   PT End of Session - 03/26/22 0947     Visit Number 3    Number of Visits 8    Date for PT Re-Evaluation 04/18/22    PT Start Time 0947    Activity Tolerance Patient tolerated treatment well    Behavior During Therapy [facility] for tasks assessed/performed             Past Medical History:  Diagnosis Date   Anxiety    Chronic bronchitis (HCC)    History of chicken pox    Hypertension    Migraines    Pneumonia    Vitamin D insufficiency    Past Surgical History:  Procedure Laterality Date   CESAREAN SECTION     Patient Active Problem List   Diagnosis Date Noted   Obesity (BMI 30.0-34.9) 07/21/2021   Essential hypertension 07/18/2021   Chronic bronchitis, unspecified chronic bronchitis type (HCC) 07/18/2021   Generalized anxiety disorder 07/18/2021   Vitamin D insufficiency 04/30/2015   Tobacco use 04/27/2015   REFERRING PROVIDER: Jene Every, MD  REFERRING DIAG: Low back pain, unspecified  Rationale for Evaluation and Treatment Rehabilitation  THERAPY DIAG:  Radiculopathy, lumbar region  Muscle weakness (generalized)  ONSET DATE: about 6 months ago  SUBJECTIVE:                                                                                                                                                                                           SUBJECTIVE STATEMENT:       Patient states that pain is not bad today so far. Longer standing times cause greater LBP. Patient had 6-7/10 LBP yesterday but was standing a lot. Reports that last night was the first night in a long time that she slept good. Has not scheduled another epidural injection yet.  PERTINENT HISTORY:  HTN, smoker  PAIN:  Are you having pain? Yes: NPRS scale: 2/10 Pain location: low back and right lower extremity Pain description:  constant numbness, sharp, ache (throughout RLE),  Aggravating factors: standing  Relieving factors: prednisone, medication, ice   PRECAUTIONS: None  WEIGHT BEARING RESTRICTIONS No  OBJECTIVE:   SCREENING FOR RED FLAGS: Bowel or bladder incontinence: Yes: she has noticed some bladder changes, but she talked to her referring physician Spinal tumors: No Cauda equina syndrome: No Compression fracture: No Abdominal aneurysm: No  LUMBAR ROM:   Active  A/PROM  eval  Flexion 32  Extension 14  Right lateral flexion 25% limited with familiar low back pain  Left  lateral flexion 25% limited  Right rotation 25% limited with familiar low back pain  Left rotation 25% limited   (Blank rows = not tested)  LOWER EXTREMITY ROM: WFL for activities assessed    LOWER EXTREMITY MMT:    MMT Right eval Left eval  Hip flexion 4-/5 4/5  Hip extension    Hip abduction    Hip adduction    Hip internal rotation    Hip external rotation    Knee flexion 4-/5 with familiar low back pain 4-/5  Knee extension 4-/5 with familiar low back pain 4/5 with familiar low back pain  Ankle dorsiflexion 4-/5 4/5  Ankle plantarflexion    Ankle inversion    Ankle eversion     (Blank rows = not tested)  GAIT: Assistive device utilized: None Level of assistance: Complete Independence Comments: reduced gait speed and stride length  TODAY'S TREATMENT     03-20-22                                   EXERCISE LOG  Exercise Repetitions and Resistance Comments  Nustep L3 x 15 mins   AB bracing with ball X15 hold 5 secs   AB bracing with ball and marching X15 reps   Lat pulldown  Green XTS x20 reps    D2 with XTS and core Green x20 reps each   Sit <> stands  X5 reps with education on proper technique    Blank cell = exercise not performed today   PATIENT EDUCATION:  Education details: Reemphasizing proper AB bracing, movement changes, posture for washing dishes and brushing teeth Person educated:  Patient Education method: Explanation, demonstration Education comprehension: verbalized understanding   HOME EXERCISE PROGRAM:   ASSESSMENT:  CLINICAL IMPRESSION: Patient presented in clinic with reports of trying to use pillows in SL for sleep as well as log rolling techniques. All movement changes and postural changes were re-emphasized today with patient verbalizing understanding. Patient progressed to more core bracing activities to simulate functional activities. Pain reported with initial rep of sit <> stand training but pain dissipated with the more reps she completed. Numbness of the RLE reported after standing exercises.  OBJECTIVE IMPAIRMENTS Abnormal gait, decreased activity tolerance, decreased mobility, difficulty walking, decreased ROM, decreased strength, hypomobility, impaired sensation, impaired tone, postural dysfunction, and pain.   ACTIVITY LIMITATIONS lifting, bending, standing, transfers, locomotion level, and caring for others  PARTICIPATION LIMITATIONS: cleaning, shopping, community activity, and occupation  PERSONAL FACTORS Time since onset of injury/illness/exacerbation and 1-2 comorbidities: HTN and current smoker  are also affecting patient's functional outcome.   REHAB POTENTIAL: Fair    CLINICAL DECISION MAKING: Evolving/moderate complexity  EVALUATION COMPLEXITY: Moderate   GOALS: Goals reviewed with patient? No  LONG TERM GOALS: Target date: 04/15/2022  Patient will be independent with her HEP.  Baseline:  Goal status: INITIAL  2.  Patient will be able to complete her daily activities without her familiar pain exceeding 5/10. Baseline:  Goal status: INITIAL  3.  Patient will be able to lift at least 10 pounds without being limited by her familiar back pain.  Baseline: unable to lift more than 5 pounds currently Goal status: INITIAL  4.  Patient will be able demonstrate her right lower extremity manual muscle testing at least equal to or  greater than the left lower extremity.  Baseline:  Goal status: INITIAL  PLAN: PT FREQUENCY: 2x/week  PT DURATION: 4  weeks  PLANNED INTERVENTIONS: Therapeutic exercises, Therapeutic activity, Neuromuscular re-education, Balance training, Gait training, Patient/Family education, Self Care, Joint mobilization, Stair training, Dry Needling, Electrical stimulation, Spinal mobilization, Cryotherapy, Moist heat, Taping, Traction, Manual therapy, and Re-evaluation.  PLAN FOR NEXT SESSION: nustep, lumbar stabilization, and modalities as needed   Marvell Fuller, PTA 03/26/2022, 11:55 AM

## 2022-04-03 ENCOUNTER — Other Ambulatory Visit: Payer: Self-pay | Admitting: Specialist

## 2022-04-03 DIAGNOSIS — M5451 Vertebrogenic low back pain: Secondary | ICD-10-CM

## 2022-04-09 ENCOUNTER — Other Ambulatory Visit: Payer: Self-pay | Admitting: Family Medicine

## 2022-04-09 DIAGNOSIS — F411 Generalized anxiety disorder: Secondary | ICD-10-CM

## 2022-04-10 ENCOUNTER — Encounter: Payer: Self-pay | Admitting: Family Medicine

## 2022-04-10 ENCOUNTER — Ambulatory Visit: Payer: BC Managed Care – PPO | Attending: Specialist | Admitting: Physical Therapy

## 2022-04-10 ENCOUNTER — Telehealth: Payer: Self-pay | Admitting: Family Medicine

## 2022-04-10 DIAGNOSIS — M6281 Muscle weakness (generalized): Secondary | ICD-10-CM | POA: Insufficient documentation

## 2022-04-10 DIAGNOSIS — F411 Generalized anxiety disorder: Secondary | ICD-10-CM

## 2022-04-10 DIAGNOSIS — M5416 Radiculopathy, lumbar region: Secondary | ICD-10-CM | POA: Insufficient documentation

## 2022-04-10 MED ORDER — ESCITALOPRAM OXALATE 10 MG PO TABS: 10.0000 mg | ORAL_TABLET | Freq: Every day | ORAL | 0 refills | Status: DC

## 2022-04-10 NOTE — Therapy (Addendum)
OUTPATIENT PHYSICAL THERAPY THORACOLUMBAR TREATMENT   Patient Name: Donna Bright MRN: [id number] DOB:1980/11/27, 41 y.o., female Today's Date: 04/10/2022   PT End of Session - 04/10/22 1112     Visit Number 4    Number of Visits 8    Date for PT Re-Evaluation 04/18/22    PT Start Time 0947    PT Stop Time 1030    PT Time Calculation (min) 43 min    Activity Tolerance Patient tolerated treatment well    Behavior During Therapy [facility] for tasks assessed/performed              Past Medical History:  Diagnosis Date   Anxiety    Chronic bronchitis ([place])    History of chicken pox    Hypertension    Migraines    Pneumonia    Vitamin D insufficiency    Past Surgical History:  Procedure Laterality Date   CESAREAN SECTION     Patient Active Problem List   Diagnosis Date Noted   Obesity (BMI 30.0-34.9) 07/21/2021   Essential hypertension 07/18/2021   Chronic bronchitis, unspecified chronic bronchitis type ([place]) 07/18/2021   Generalized anxiety disorder 07/18/2021   Vitamin D insufficiency 04/30/2015   Tobacco use 04/27/2015   REFERRING PROVIDER: Susa Day, MD  REFERRING DIAG: Low back pain, unspecified  Rationale for Evaluation and Treatment Rehabilitation  THERAPY DIAG:  Radiculopathy, lumbar region  Muscle weakness (generalized)  ONSET DATE: about 6 months ago  SUBJECTIVE:                                                                                                                                                                                           SUBJECTIVE STATEMENT:       Has been busy as her father passed away last week and has been hurting more. Patient has injection tomorrow late morning.  PERTINENT HISTORY:  HTN, smoker  PAIN:  Are you having pain? Yes: NPRS scale: no number provided/10 Pain location: low back and right lower extremity Pain description: constant numbness, sharp, ache (throughout RLE),  Aggravating factors: standing   Relieving factors: prednisone, medication, ice   PRECAUTIONS: None  WEIGHT BEARING RESTRICTIONS No  OBJECTIVE:   SCREENING FOR RED FLAGS: Bowel or bladder incontinence: Yes: she has noticed some bladder changes, but she talked to her referring physician Spinal tumors: No Cauda equina syndrome: No Compression fracture: No Abdominal aneurysm: No  LUMBAR ROM:   Active  A/PROM  eval  Flexion 32  Extension 14  Right lateral flexion 25% limited with familiar low back pain  Left lateral flexion 25% limited  Right rotation 25% limited  with familiar low back pain  Left rotation 25% limited   (Blank rows = not tested)  LOWER EXTREMITY ROM: WFL for activities assessed    LOWER EXTREMITY MMT:    MMT Right eval Left eval  Hip flexion 4-/5 4/5  Hip extension    Hip abduction    Hip adduction    Hip internal rotation    Hip external rotation    Knee flexion 4-/5 with familiar low back pain 4-/5  Knee extension 4-/5 with familiar low back pain 4/5 with familiar low back pain  Ankle dorsiflexion 4-/5 4/5  Ankle plantarflexion    Ankle inversion    Ankle eversion     (Blank rows = not tested)  GAIT: Assistive device utilized: None Level of assistance: Complete Independence Comments: reduced gait speed and stride length  TODAY'S TREATMENT     03-20-22                                   EXERCISE LOG  Exercise Repetitions and Resistance Comments  Nustep L3 x 18 mins   Ab bracing at cabinet X15 reps 5 sec   Supine bridging X20 reps 3 sec holds   Lat pulldown  Green XTS x20 reps    Supine marching with AB bracing X30 reps    Blank cell = exercise not performed today   PATIENT EDUCATION:  Education details: Reemphasizing proper AB bracing, movement changes, posture for washing dishes and brushing teeth Person educated: Patient Education method: Explanation, demonstration Education comprehension: verbalized understanding   HOME EXERCISE  PROGRAM:   ASSESSMENT:  CLINICAL IMPRESSION: Patient arrived in clinic with reports of high level pain throughout her back but has had a lot of stress recently following the passing of her father and fatigue. A slower paced treatment completed due to her pain and overall fatigue. Patient reported supine exercises feeling good as she was able to rest from standing. Patient indicated that standing causes more pain. Patient to have injection tomorrow in the late morning.  OBJECTIVE IMPAIRMENTS Abnormal gait, decreased activity tolerance, decreased mobility, difficulty walking, decreased ROM, decreased strength, hypomobility, impaired sensation, impaired tone, postural dysfunction, and pain.   ACTIVITY LIMITATIONS lifting, bending, standing, transfers, locomotion level, and caring for others  PARTICIPATION LIMITATIONS: cleaning, shopping, community activity, and occupation  PERSONAL FACTORS Time since onset of injury/illness/exacerbation and 1-2 comorbidities: HTN and current smoker  are also affecting patient's functional outcome.   REHAB POTENTIAL: Fair    CLINICAL DECISION MAKING: Evolving/moderate complexity  EVALUATION COMPLEXITY: Moderate   GOALS: Goals reviewed with patient? No  LONG TERM GOALS: Target date: 04/15/2022  Patient will be independent with her HEP.  Baseline:  Goal status: INITIAL  2.  Patient will be able to complete her daily activities without her familiar pain exceeding 5/10. Baseline:  Goal status: INITIAL  3.  Patient will be able to lift at least 10 pounds without being limited by her familiar back pain.  Baseline: unable to lift more than 5 pounds currently Goal status: INITIAL  4.  Patient will be able demonstrate her right lower extremity manual muscle testing at least equal to or greater than the left lower extremity.  Baseline:  Goal status: INITIAL  PLAN: PT FREQUENCY: 2x/week  PT DURATION: 4 weeks  PLANNED INTERVENTIONS: Therapeutic  exercises, Therapeutic activity, Neuromuscular re-education, Balance training, Gait training, Patient/Family education, Self Care, Joint mobilization, Stair training, Dry Needling, Electrical stimulation,  Spinal mobilization, Cryotherapy, Moist heat, Taping, Traction, Manual therapy, and Re-evaluation.  PLAN FOR NEXT SESSION: nustep, lumbar stabilization, and modalities as needed   Marvell Fuller, PTA 04/10/2022, 12:03 PM

## 2022-04-10 NOTE — Telephone Encounter (Signed)
Pt aware refill sent to pharmacy 

## 2022-04-10 NOTE — Telephone Encounter (Signed)
Letter sent.

## 2022-04-10 NOTE — Telephone Encounter (Signed)
  Prescription Request  04/10/2022   What is the name of the medication or equipment? LEXAPRO  Have you contacted your pharmacy to request a refill? YES  Which pharmacy would you like this sent to?  [facility]  Pt is scheduled to see PCP on Monday 04/14/22 for med refill but doesn't have enough to last her until her appt. Can enough medicine be sent in for her to last her until her appt?

## 2022-04-10 NOTE — Telephone Encounter (Signed)
Britney NTBS by new provider 30 days given 03/07/22

## 2022-04-11 ENCOUNTER — Ambulatory Visit
Admission: RE | Admit: 2022-04-11 | Discharge: 2022-04-11 | Disposition: A | Discharge status: Home / Self Care | Payer: BC Managed Care – PPO | Source: Ambulatory Visit | Attending: Specialist | Admitting: Specialist

## 2022-04-11 ENCOUNTER — Ambulatory Visit: Payer: BC Managed Care – PPO

## 2022-04-11 DIAGNOSIS — M4727 Other spondylosis with radiculopathy, lumbosacral region: Secondary | ICD-10-CM | POA: Diagnosis not present

## 2022-04-11 DIAGNOSIS — M5416 Radiculopathy, lumbar region: Secondary | ICD-10-CM | POA: Diagnosis not present

## 2022-04-11 DIAGNOSIS — M6281 Muscle weakness (generalized): Secondary | ICD-10-CM

## 2022-04-11 DIAGNOSIS — M5451 Vertebrogenic low back pain: Secondary | ICD-10-CM

## 2022-04-11 MED ORDER — METHYLPREDNISOLONE ACETATE 40 MG/ML INJ SUSP (RADIOLOG
80.0000 mg | Freq: Once | INTRAMUSCULAR | Status: AC
  Administered 2022-04-11: 80 mg via EPIDURAL

## 2022-04-11 MED ORDER — IOPAMIDOL (ISOVUE-M 200) INJECTION 41%
1.0000 mL | Freq: Once | INTRAMUSCULAR | Status: AC
  Administered 2022-04-11: 1 mL via EPIDURAL

## 2022-04-11 NOTE — Discharge Instructions (Signed)

## 2022-04-11 NOTE — Therapy (Signed)
OUTPATIENT PHYSICAL THERAPY THORACOLUMBAR TREATMENT   Patient Name: Donna Bright MRN: [id number] DOB:04/18/1981, 41 y.o., female Today's Date: 04/11/2022   PT End of Session - 04/11/22 0903     Visit Number 5    Number of Visits 8    Date for PT Re-Evaluation 04/18/22    PT Start Time 0900    PT Stop Time 0945    PT Time Calculation (min) 45 min    Activity Tolerance Patient tolerated treatment well    Behavior During Therapy [facility] for tasks assessed/performed              Past Medical History:  Diagnosis Date   Anxiety    Chronic bronchitis ([place])    History of chicken pox    Hypertension    Migraines    Pneumonia    Vitamin D insufficiency    Past Surgical History:  Procedure Laterality Date   CESAREAN SECTION     Patient Active Problem List   Diagnosis Date Noted   Obesity (BMI 30.0-34.9) 07/21/2021   Essential hypertension 07/18/2021   Chronic bronchitis, unspecified chronic bronchitis type ([place]) 07/18/2021   Generalized anxiety disorder 07/18/2021   Vitamin D insufficiency 04/30/2015   Tobacco use 04/27/2015   REFERRING PROVIDER: Susa Day, MD  REFERRING DIAG: Low back pain, unspecified  Rationale for Evaluation and Treatment Rehabilitation  THERAPY DIAG:  Radiculopathy, lumbar region  Muscle weakness (generalized)  ONSET DATE: about 6 months ago  SUBJECTIVE:                                                                                                                                                                                           SUBJECTIVE STATEMENT:      Pt reports back soreness after last treatment session.  Anxious about injection today.  PERTINENT HISTORY:  HTN, smoker  PAIN:  Are you having pain? Yes: NPRS scale: 3-4/10 Pain location: low back and right lower extremity Pain description: constant numbness, sharp, ache (throughout RLE),  Aggravating factors: standing  Relieving factors: prednisone, medication,  ice   PRECAUTIONS: None  WEIGHT BEARING RESTRICTIONS No  OBJECTIVE:   SCREENING FOR RED FLAGS: Bowel or bladder incontinence: Yes: she has noticed some bladder changes, but she talked to her referring physician Spinal tumors: No Cauda equina syndrome: No Compression fracture: No Abdominal aneurysm: No  LUMBAR ROM:   Active  A/PROM  eval  Flexion 32  Extension 14  Right lateral flexion 25% limited with familiar low back pain  Left lateral flexion 25% limited  Right rotation 25% limited with familiar low back pain  Left rotation 25% limited   (  Blank rows = not tested)  LOWER EXTREMITY ROM: WFL for activities assessed    LOWER EXTREMITY MMT:    MMT Right eval Left eval  Hip flexion 4-/5 4/5  Hip extension    Hip abduction    Hip adduction    Hip internal rotation    Hip external rotation    Knee flexion 4-/5 with familiar low back pain 4-/5  Knee extension 4-/5 with familiar low back pain 4/5 with familiar low back pain  Ankle dorsiflexion 4-/5 4/5  Ankle plantarflexion    Ankle inversion    Ankle eversion     (Blank rows = not tested)  GAIT: Assistive device utilized: None Level of assistance: Complete Independence Comments: reduced gait speed and stride length  TODAY'S TREATMENT                       10/6           EXERCISE LOG  Exercise Repetitions and Resistance Comments  Nustep L3 x 20 mins   Ab bracing at cabinet X15 reps 5 sec   Supine bridging X20 reps 3 sec holds   Lat pulldown  Green XTS x35 reps    [employer] 20 reps x 3 sec    [employer] 20 reps x 3 sec   Supine marching with AB bracing X30 reps   SKTC 10 reps x 3 sec   LTR 10 reps x 3 sec    Blank cell = exercise not performed today   PATIENT EDUCATION:  Education details: Reemphasizing proper AB bracing, movement changes, posture for washing dishes and brushing teeth Person educated: Patient Education method: Explanation, demonstration Education comprehension: verbalized  understanding   HOME EXERCISE PROGRAM:   ASSESSMENT:  CLINICAL IMPRESSION: Pt arrives for today's treatment session reporting 3-4/10 low back pain.  Pt instructed in supine lumbar stretches to decrease pain and tone. Pt also introduced to standing ball press and roll outs without issue or complaint.  Pt reports decreased pain and tone at compleiton of today's treatment session.  OBJECTIVE IMPAIRMENTS Abnormal gait, decreased activity tolerance, decreased mobility, difficulty walking, decreased ROM, decreased strength, hypomobility, impaired sensation, impaired tone, postural dysfunction, and pain.   ACTIVITY LIMITATIONS lifting, bending, standing, transfers, locomotion level, and caring for others  PARTICIPATION LIMITATIONS: cleaning, shopping, community activity, and occupation  PERSONAL FACTORS Time since onset of injury/illness/exacerbation and 1-2 comorbidities: HTN and current smoker  are also affecting patient's functional outcome.   REHAB POTENTIAL: Fair    CLINICAL DECISION MAKING: Evolving/moderate complexity  EVALUATION COMPLEXITY: Moderate   GOALS: Goals reviewed with patient? No  LONG TERM GOALS: Target date: 04/15/2022  Patient will be independent with her HEP.  Baseline:  Goal status: MET  2.  Patient will be able to complete her daily activities without her familiar pain exceeding 5/10. Baseline: 04/11/22: depends on the day Goal status: IN PROGRESS  3.  Patient will be able to lift at least 10 pounds without being limited by her familiar back pain.  Baseline: unable to lift more than 5 pounds currently Goal status: IN PROGRESS  4.  Patient will be able demonstrate her right lower extremity manual muscle testing at least equal to or greater than the left lower extremity.  Baseline:  Goal status: IN PROGRESS  PLAN: PT FREQUENCY: 2x/week  PT DURATION: 4 weeks  PLANNED INTERVENTIONS: Therapeutic exercises, Therapeutic activity, Neuromuscular  re-education, Balance training, Gait training, Patient/Family education, Self Care, Joint mobilization, Stair training, Dry  Needling, Electrical stimulation, Spinal mobilization, Cryotherapy, Moist heat, Taping, Traction, Manual therapy, and Re-evaluation.  PLAN FOR NEXT SESSION: nustep, lumbar stabilization, and modalities as needed   Kathrynn Ducking, PTA 04/11/2022, 9:57 AM

## 2022-04-14 ENCOUNTER — Ambulatory Visit (INDEPENDENT_AMBULATORY_CARE_PROVIDER_SITE_OTHER): Payer: BC Managed Care – PPO | Admitting: Family Medicine

## 2022-04-14 ENCOUNTER — Encounter: Payer: Self-pay | Admitting: Family Medicine

## 2022-04-14 VITALS — BP 131/85 | HR 65 | Temp 97.5°F | Resp 20 | Ht 62.5 in | Wt 173.0 lb

## 2022-04-14 DIAGNOSIS — Z872 Personal history of diseases of the skin and subcutaneous tissue: Secondary | ICD-10-CM

## 2022-04-14 DIAGNOSIS — F411 Generalized anxiety disorder: Secondary | ICD-10-CM | POA: Diagnosis not present

## 2022-04-14 DIAGNOSIS — E559 Vitamin D deficiency, unspecified: Secondary | ICD-10-CM | POA: Diagnosis not present

## 2022-04-14 DIAGNOSIS — I10 Essential (primary) hypertension: Secondary | ICD-10-CM

## 2022-04-14 DIAGNOSIS — M1990 Unspecified osteoarthritis, unspecified site: Secondary | ICD-10-CM | POA: Diagnosis not present

## 2022-04-14 MED ORDER — LISINOPRIL 10 MG PO TABS
10.0000 mg | ORAL_TABLET | Freq: Every day | ORAL | 2 refills | Status: DC
Start: 2022-04-14 — End: 2022-07-08

## 2022-04-14 MED ORDER — ESCITALOPRAM OXALATE 10 MG PO TABS: 10.0000 mg | ORAL_TABLET | Freq: Every day | ORAL | 1 refills | Status: DC

## 2022-04-14 NOTE — Progress Notes (Signed)
Assessment & Plan:  1. Arthritis Well controlled on current regimen.   2. Vitamin D deficiency Encouraged to start over-the-counter vitamin D3 1000 units daily.  3. Generalized anxiety disorder Well controlled on current regimen.  - escitalopram (LEXAPRO) 10 MG tablet; Take 1 tablet (10 mg total) by mouth daily.  Dispense: 90 tablet; Refill: 1  4. Essential hypertension Uncontrolled.  Starting on lisinopril 10 mg once daily.  Encouraged to monitor her blood pressure at home, keep a log, and bring it with her to her next appointment.  Education provided on the DASH diet. - lisinopril (ZESTRIL) 10 MG tablet; Take 1 tablet (10 mg total) by mouth daily.  Dispense: 30 tablet; Refill: 2  5. History of breast abscess Discussed abscesses.  Advised if they recur, she can schedule an appointment for assessment and treatment.   Return in about 6 weeks (around 05/26/2022) for HTN with Dr. Livia Snellen.  Hendricks Limes, MSN, APRN, FNP-C [facility]  Subjective:    Patient ID: Donna Bright, female    DOB: 12-Mar-1981, 41 y.o.   MRN: [id number]  Patient Care Team: Loman Brooklyn, FNP as PCP - General (Family Medicine)   Chief Complaint:  Chief Complaint  Patient presents with   Medical Management of Chronic Issues   Hypertension    HPI: Donna Bright is a 41 y.o. female presenting on 04/14/2022 for Medical Management of Chronic Issues and Hypertension  Patient is accompanied by her daughter's sister, who she is okay with being present.   Arthritis: taking diclofenac twice daily.    Chronic Bronchitis: patient does see pulmonology, Dr. Shearon Stalls. She was last seen in January 2023.   Vitamin D deficiency: patient is not taking an over-the-counter vitamin D supplement. Last vitamin D level low in January 2023.  She did not ever take a vitamin D supplement after being advised to do so in January.   Anxiety: patient reports she is doing well with Lexapro 10 mg  daily.     04/14/2022    9:19 AM 07/25/2021    8:26 AM 07/18/2021   10:08 AM 05/15/2021   11:24 AM  GAD 7 : Generalized Anxiety Score  Nervous, Anxious, on Edge 0 0 0 0  Control/stop worrying 0 0 0 0  Worry too much - different things 0 0 0 0  Trouble relaxing 0 0 0 0  Restless 0 0 0 0  Easily annoyed or irritable 0 0 0 0  Afraid - awful might happen 0 0 0 0  Total GAD 7 Score 0 0 0 0  Anxiety Difficulty Not difficult at all Not difficult at all Not difficult at all     New complaints: Patient is concerned about her blood pressure.  She reports she is getting readings at home 130-140s/80-90s.  States at times she does feel lightheaded and is flushed in the face.  She has frequent headaches.  She also reports a spot on her left breast that was oozing a couple of weeks ago, but has now resolved.   Social history:  Relevant past medical, surgical, family and social history reviewed and updated as indicated. Interim medical history since our last visit reviewed.  Allergies and medications reviewed and updated.  DATA REVIEWED: CHART IN EPIC  ROS: Negative unless specifically indicated above in HPI.    Current Outpatient Medications:    Albuterol Sulfate (PROAIR RESPICLICK) 470 (90 Base) MCG/ACT AEPB, Inhale 2 puffs into the lungs every 4 (four) hours as needed (  shortness of breath)., Disp: 1 each, Rfl: 5   diclofenac (VOLTAREN) 75 MG EC tablet, Take 1 tablet (75 mg total) by mouth 2 (two) times daily as needed., Disp: 60 tablet, Rfl: 2   escitalopram (LEXAPRO) 10 MG tablet, Take 1 tablet (10 mg total) by mouth daily., Disp: 30 tablet, Rfl: 0   Tiotropium Bromide-Olodaterol (STIOLTO RESPIMAT) 2.5-2.5 MCG/ACT AERS, Inhale 1 puff into the lungs daily., Disp: 1 each, Rfl: 5   Allergies  Allergen Reactions   Sulfamethoxazole-Trimethoprim Hives, Rash and Other (See Comments)   Past Medical History:  Diagnosis Date   Anxiety    Chronic bronchitis (HCC)    History of chicken pox     Hypertension    Migraines    Pneumonia    Vitamin D insufficiency     Past Surgical History:  Procedure Laterality Date   CESAREAN SECTION      Social History   Socioeconomic History   Marital status: Divorced    Spouse name: Not on file   Number of children: Not on file   Years of education: Not on file   Highest education level: Not on file  Occupational History   Not on file  Tobacco Use   Smoking status: Some Days    Packs/day: 0.50    Types: Cigarettes   Smokeless tobacco: Former  Substance and Sexual Activity   Alcohol use: No    Alcohol/week: 0.0 standard drinks of alcohol   Drug use: No   Sexual activity: Never    Birth control/protection: None  Other Topics Concern   Not on file  Social History Narrative   Separated. 68 Child 41 year old female.    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    Wears seatbelts. Smoke alarm in the home.    Lives with roommate and child.    Social Determinants of Health   Financial Resource Strain: Not on file  Food Insecurity: Not on file  Transportation Needs: Not on file  Physical Activity: Not on file  Stress: Not on file  Social Connections: Not on file  Intimate Partner Violence: Not on file        Objective:    BP 131/85   Pulse 65   Temp (!) 97.5 F (36.4 C)   Resp 20   Ht 5' 2.5" (1.588 m)   Wt 173 lb (78.5 kg)   LMP 03/17/2022 (Approximate)   SpO2 95%   BMI 31.14 kg/m   Wt Readings from Last 3 Encounters:  04/14/22 173 lb (78.5 kg)  08/01/21 172 lb 3.2 oz (78.1 kg)  07/25/21 173 lb 6.4 oz (78.7 kg)    Physical Exam Vitals reviewed.  Constitutional:      General: She is not in acute distress.    Appearance: Normal appearance. She is obese. She is not ill-appearing, toxic-appearing or diaphoretic.  HENT:     Head: Normocephalic and atraumatic.  Eyes:     General: No scleral icterus.       Right eye: No discharge.        Left eye: No discharge.      Conjunctiva/sclera: Conjunctivae normal.  Cardiovascular:     Rate and Rhythm: Normal rate and regular rhythm.     Heart sounds: Normal heart sounds. No murmur heard.    No friction rub. No gallop.  Pulmonary:     Effort: Pulmonary effort is normal. No respiratory distress.     Breath sounds: Normal breath sounds.  No stridor. No wheezing, rhonchi or rales.  Musculoskeletal:        General: Normal range of motion.     Cervical back: Normal range of motion.  Skin:    General: Skin is warm and dry.     Capillary Refill: Capillary refill takes less than 2 seconds.  Neurological:     General: No focal deficit present.     Mental Status: She is alert and oriented to person, place, and time. Mental status is at baseline.  Psychiatric:        Mood and Affect: Mood normal.        Behavior: Behavior normal.        Thought Content: Thought content normal.        Judgment: Judgment normal.     Lab Results  Component Value Date   TSH 0.979 07/18/2021   Lab Results  Component Value Date   WBC 8.0 07/18/2021   HGB 13.3 07/18/2021   HCT 40.1 07/18/2021   MCV 87 07/18/2021   PLT 345 07/18/2021   Lab Results  Component Value Date   NA 137 07/18/2021   K 4.5 07/18/2021   CO2 24 07/18/2021   GLUCOSE 109 (H) 07/18/2021   BUN 12 07/18/2021   CREATININE 0.75 07/18/2021   BILITOT 0.2 07/18/2021   ALKPHOS 94 07/18/2021   AST 17 07/18/2021   ALT 16 07/18/2021   PROT 7.6 07/18/2021   ALBUMIN 4.1 07/18/2021   CALCIUM 9.4 07/18/2021   EGFR 103 07/18/2021   GFR 93.72 04/27/2015   Lab Results  Component Value Date   CHOL 175 07/18/2021   Lab Results  Component Value Date   HDL 39 (L) 07/18/2021   Lab Results  Component Value Date   LDLCALC 110 (H) 07/18/2021   Lab Results  Component Value Date   TRIG 146 07/18/2021   Lab Results  Component Value Date   CHOLHDL 4.5 (H) 07/18/2021   Lab Results  Component Value Date   HGBA1C 5.7 04/27/2015

## 2022-04-14 NOTE — Patient Instructions (Signed)
Start taking vitamin D3 1,000 units once daily.

## 2022-04-15 ENCOUNTER — Encounter: Payer: BC Managed Care – PPO | Admitting: Physical Therapy

## 2022-04-16 DIAGNOSIS — B349 Viral infection, unspecified: Secondary | ICD-10-CM | POA: Diagnosis not present

## 2022-04-16 DIAGNOSIS — K529 Noninfective gastroenteritis and colitis, unspecified: Secondary | ICD-10-CM | POA: Diagnosis not present

## 2022-04-16 DIAGNOSIS — R0981 Nasal congestion: Secondary | ICD-10-CM | POA: Diagnosis not present

## 2022-04-16 DIAGNOSIS — Z6831 Body mass index (BMI) 31.0-31.9, adult: Secondary | ICD-10-CM | POA: Diagnosis not present

## 2022-04-18 ENCOUNTER — Ambulatory Visit: Payer: BC Managed Care – PPO | Admitting: *Deleted

## 2022-04-18 ENCOUNTER — Encounter: Payer: Self-pay | Admitting: *Deleted

## 2022-04-18 DIAGNOSIS — M5416 Radiculopathy, lumbar region: Secondary | ICD-10-CM

## 2022-04-18 DIAGNOSIS — M6281 Muscle weakness (generalized): Secondary | ICD-10-CM

## 2022-04-18 NOTE — Therapy (Signed)
OUTPATIENT PHYSICAL THERAPY THORACOLUMBAR TREATMENT   Patient Name: Donna Bright MRN: [id number] DOB:[birth year], 41 y.o., female Today's Date: 04/18/2022   PT End of Session - 04/18/22 0910     Visit Number 6    Number of Visits 8    Date for PT Re-Evaluation 04/18/22    PT Start Time 0900    PT Stop Time 0945    PT Time Calculation (min) 45 min              Past Medical History:  Diagnosis Date   Anxiety    Chronic bronchitis ([place])    History of chicken pox    Hypertension    Migraines    Pneumonia    Vitamin D insufficiency    Past Surgical History:  Procedure Laterality Date   CESAREAN SECTION     Patient Active Problem List   Diagnosis Date Noted   Low back pain 01/02/2022   Obesity (BMI 30.0-34.9) 07/21/2021   Essential hypertension 07/18/2021   Chronic bronchitis, unspecified chronic bronchitis type ([place]) 07/18/2021   Generalized anxiety disorder 07/18/2021   Vitamin D insufficiency 04/30/2015   Tobacco use 04/27/2015   REFERRING PROVIDER: Susa Day, MD  REFERRING DIAG: Low back pain, unspecified  Rationale for Evaluation and Treatment Rehabilitation  THERAPY DIAG:  Radiculopathy, lumbar region  Muscle weakness (generalized)  ONSET DATE: about 6 months ago  SUBJECTIVE:                                                                                                                                                                                           SUBJECTIVE STATEMENT:      Pt reports back soreness after last treatment session.  Anxious about injection today.  PERTINENT HISTORY:  HTN, smoker  PAIN:  Are you having pain? Yes: NPRS scale: 3-4/10 Pain location: low back and right lower extremity Pain description: constant numbness, sharp, ache (throughout RLE),  Aggravating factors: standing  Relieving factors: prednisone, medication, ice   PRECAUTIONS: None  WEIGHT BEARING RESTRICTIONS No  OBJECTIVE:   SCREENING FOR RED  FLAGS: Bowel or bladder incontinence: Yes: she has noticed some bladder changes, but she talked to her referring physician Spinal tumors: No Cauda equina syndrome: No Compression fracture: No Abdominal aneurysm: No  LUMBAR ROM:   Active  A/PROM  eval  Flexion 32  Extension 14  Right lateral flexion 25% limited with familiar low back pain  Left lateral flexion 25% limited  Right rotation 25% limited with familiar low back pain  Left rotation 25% limited   (Blank rows = not tested)  LOWER EXTREMITY ROM: WFL for activities  assessed    LOWER EXTREMITY MMT:    MMT Right eval Left eval  Hip flexion 4-/5 4/5  Hip extension    Hip abduction    Hip adduction    Hip internal rotation    Hip external rotation    Knee flexion 4-/5 with familiar low back pain 4-/5  Knee extension 4-/5 with familiar low back pain 4/5 with familiar low back pain  Ankle dorsiflexion 4-/5 4/5  Ankle plantarflexion    Ankle inversion    Ankle eversion     (Blank rows = not tested)  GAIT: Assistive device utilized: None Level of assistance: Complete Independence Comments: reduced gait speed and stride length  TODAY'S TREATMENT                       10/6           EXERCISE LOG  Exercise Repetitions and Resistance Comments  Nustep L3 x 20 mins   Ab bracing at cabinet X15 reps 5 sec   Supine bridging X20 reps 3 sec holds   Lat pulldown  Green XTS x35 reps    [employer] 20 reps x 3 sec    [employer] 20 reps x 3 sec   Supine marching with AB bracing X30 reps   SKTC 10 reps x 3 sec   LTR 10 reps x 3 sec    Blank cell = exercise not performed today   PATIENT EDUCATION:  Education details: Reemphasizing proper AB bracing, movement changes, posture for washing dishes and brushing teeth Person educated: Patient Education method: Explanation, demonstration Education comprehension: verbalized understanding   HOME EXERCISE PROGRAM:   ASSESSMENT:  CLINICAL IMPRESSION: Pt arrives for today's  treatment session reporting 3-4/10 low back pain.  Pt instructed in supine lumbar stretches to decrease pain and tone. Pt also introduced to standing ball press and roll outs without issue or complaint.  Pt reports decreased pain and tone at compleiton of today's treatment session.  OBJECTIVE IMPAIRMENTS Abnormal gait, decreased activity tolerance, decreased mobility, difficulty walking, decreased ROM, decreased strength, hypomobility, impaired sensation, impaired tone, postural dysfunction, and pain.   ACTIVITY LIMITATIONS lifting, bending, standing, transfers, locomotion level, and caring for others  PARTICIPATION LIMITATIONS: cleaning, shopping, community activity, and occupation  PERSONAL FACTORS Time since onset of injury/illness/exacerbation and 1-2 comorbidities: HTN and current smoker  are also affecting patient's functional outcome.   REHAB POTENTIAL: Fair    CLINICAL DECISION MAKING: Evolving/moderate complexity  EVALUATION COMPLEXITY: Moderate   GOALS: Goals reviewed with patient? No  LONG TERM GOALS: Target date: 04/15/2022  Patient will be independent with her HEP.  Baseline:  Goal status: MET  2.  Patient will be able to complete her daily activities without her familiar pain exceeding 5/10. Baseline: 04/11/22: depends on the day Goal status: IN PROGRESS  3.  Patient will be able to lift at least 10 pounds without being limited by her familiar back pain.  Baseline: unable to lift more than 5 pounds currently Goal status: IN PROGRESS  4.  Patient will be able demonstrate her right lower extremity manual muscle testing at least equal to or greater than the left lower extremity.  Baseline:  Goal status: IN PROGRESS  PLAN: PT FREQUENCY: 2x/week  PT DURATION: 4 weeks  PLANNED INTERVENTIONS: Therapeutic exercises, Therapeutic activity, Neuromuscular re-education, Balance training, Gait training, Patient/Family education, Self Care, Joint mobilization, Stair training,  Dry Needling, Electrical stimulation, Spinal mobilization, Cryotherapy, Moist heat, Taping, Traction, Manual therapy,  and Re-evaluation.  PLAN FOR NEXT SESSION: nustep, lumbar stabilization, and modalities as needed   Arryn Terrones,CHRIS, PTA 04/18/2022, 9:53 AM  OUTPATIENT PHYSICAL THERAPY THORACOLUMBAR TREATMENT   Patient Name: Donna Bright MRN: [id number] DOB:1980/07/26, 41 y.o., female Today's Date: 04/18/2022   PT End of Session - 04/18/22 0910     Visit Number 6    Number of Visits 8    Date for PT Re-Evaluation 04/18/22    PT Start Time 0900    PT Stop Time 0945    PT Time Calculation (min) 45 min              Past Medical History:  Diagnosis Date   Anxiety    Chronic bronchitis ([place])    History of chicken pox    Hypertension    Migraines    Pneumonia    Vitamin D insufficiency    Past Surgical History:  Procedure Laterality Date   CESAREAN SECTION     Patient Active Problem List   Diagnosis Date Noted   Low back pain 01/02/2022   Obesity (BMI 30.0-34.9) 07/21/2021   Essential hypertension 07/18/2021   Chronic bronchitis, unspecified chronic bronchitis type ([place]) 07/18/2021   Generalized anxiety disorder 07/18/2021   Vitamin D insufficiency 04/30/2015   Tobacco use 04/27/2015   REFERRING PROVIDER: Susa Day, MD  REFERRING DIAG: Low back pain, unspecified  Rationale for Evaluation and Treatment Rehabilitation  THERAPY DIAG:  Radiculopathy, lumbar region  Muscle weakness (generalized)  ONSET DATE: about 6 months ago  SUBJECTIVE:                                                                                                                                                                                           SUBJECTIVE STATEMENT:      Injection has helped. LBP today is minimal. To MD next Friday  PERTINENT HISTORY:  HTN, smoker  PAIN:  Are you having pain? Yes: NPRS scale: 3-4/10 Pain location: low back and right lower  extremity Pain description: constant numbness, sharp, ache (throughout RLE),  Aggravating factors: standing  Relieving factors: prednisone, medication, ice   PRECAUTIONS: None  WEIGHT BEARING RESTRICTIONS No  OBJECTIVE:   SCREENING FOR RED FLAGS: Bowel or bladder incontinence: Yes: she has noticed some bladder changes, but she talked to her referring physician Spinal tumors: No Cauda equina syndrome: No Compression fracture: No Abdominal aneurysm: No  LUMBAR ROM:   Active  A/PROM  eval  Flexion 32  Extension 14  Right lateral flexion 25% limited with familiar low back pain  Left lateral flexion 25% limited  Right rotation 25% limited with familiar low back  pain  Left rotation 25% limited   (Blank rows = not tested)  LOWER EXTREMITY ROM: WFL for activities assessed    LOWER EXTREMITY MMT:    MMT Right eval Left eval  Hip flexion 4-/5 4/5  Hip extension    Hip abduction    Hip adduction    Hip internal rotation    Hip external rotation    Knee flexion 4-/5 with familiar low back pain 4-/5  Knee extension 4-/5 with familiar low back pain 4/5 with familiar low back pain  Ankle dorsiflexion 4-/5 4/5  Ankle plantarflexion    Ankle inversion    Ankle eversion     (Blank rows = not tested)  GAIT: Assistive device utilized: None Level of assistance: Complete Independence Comments: reduced gait speed and stride length  TODAY'S TREATMENT                       10/13           EXERCISE LOG  Exercise Repetitions and Resistance Comments  Nustep L3 x 15 mins   Draw-in  X15 hold 5 sec for TA activation   Supine bridging 2x10 reps 5 sec holds   Lat pulldown  Blue XTS x20 reps    [employer]    Supine marching with AB bracing  2x10  each leg hold 5 secs   SKTC    LTR     Blank cell = exercise not performed today   Reviewed body mechanics for ADL's with AB bracing    PATIENT EDUCATION:  Education details: Reemphasizing proper AB bracing,  movement changes, posture for washing dishes and brushing teeth Person educated: Patient Education method: Explanation, demonstration Education comprehension: verbalized understanding   HOME EXERCISE PROGRAM:   ASSESSMENT:  CLINICAL IMPRESSION: Pt arrives for today's treatment session reporting minimal low back pain.  Pt instructed in standing and  supine core  stabilization exs and did well. No reports of increased pain with today's interventions.    OBJECTIVE IMPAIRMENTS Abnormal gait, decreased activity tolerance, decreased mobility, difficulty walking, decreased ROM, decreased strength, hypomobility, impaired sensation, impaired tone, postural dysfunction, and pain.   ACTIVITY LIMITATIONS lifting, bending, standing, transfers, locomotion level, and caring for others  PARTICIPATION LIMITATIONS: cleaning, shopping, community activity, and occupation  PERSONAL FACTORS Time since onset of injury/illness/exacerbation and 1-2 comorbidities: HTN and current smoker  are also affecting patient's functional outcome.   REHAB POTENTIAL: Fair    CLINICAL DECISION MAKING: Evolving/moderate complexity  EVALUATION COMPLEXITY: Moderate   GOALS: Goals reviewed with patient? No  LONG TERM GOALS: Target date: 04/15/2022  Patient will be independent with her HEP.  Baseline:  Goal status: MET  2.  Patient will be able to complete her daily activities without her familiar pain exceeding 5/10. Baseline: 04/11/22: depends on the day Goal status: IN PROGRESS  3.  Patient will be able to lift at least 10 pounds without being limited by her familiar back pain.  Baseline: unable to lift more than 5 pounds currently Goal status: IN PROGRESS  4.  Patient will be able demonstrate her right lower extremity manual muscle testing at least equal to or greater than the left lower extremity.  Baseline:  Goal status: IN PROGRESS  PLAN: PT FREQUENCY: 2x/week  PT DURATION: 4 weeks  PLANNED  INTERVENTIONS: Therapeutic exercises, Therapeutic activity, Neuromuscular re-education, Balance training, Gait training, Patient/Family education, Self Care, Joint mobilization, Stair training, Dry Needling, Electrical stimulation, Spinal mobilization,  Cryotherapy, Moist heat, Taping, Traction, Manual therapy, and Re-evaluation.  PLAN FOR NEXT SESSION: nustep, lumbar stabilization, and modalities as needed   Mahari Vankirk,CHRIS, PTA 04/18/2022, 9:53 AM

## 2022-04-22 ENCOUNTER — Ambulatory Visit: Payer: BC Managed Care – PPO | Admitting: *Deleted

## 2022-04-23 ENCOUNTER — Encounter: Payer: BC Managed Care – PPO | Admitting: Physical Therapy

## 2022-04-25 DIAGNOSIS — M5451 Vertebrogenic low back pain: Secondary | ICD-10-CM | POA: Diagnosis not present

## 2022-04-25 DIAGNOSIS — M5136 Other intervertebral disc degeneration, lumbar region: Secondary | ICD-10-CM | POA: Diagnosis not present

## 2022-04-28 ENCOUNTER — Telehealth: Payer: Self-pay | Admitting: Family Medicine

## 2022-04-28 NOTE — Telephone Encounter (Signed)
Appointment scheduled.

## 2022-05-02 ENCOUNTER — Ambulatory Visit: Payer: BC Managed Care – PPO

## 2022-05-02 DIAGNOSIS — M6281 Muscle weakness (generalized): Secondary | ICD-10-CM

## 2022-05-02 DIAGNOSIS — M5416 Radiculopathy, lumbar region: Secondary | ICD-10-CM

## 2022-05-02 NOTE — Therapy (Addendum)
OUTPATIENT PHYSICAL THERAPY THORACOLUMBAR TREATMENT   Patient Name: Donna Bright MRN: [id number] DOB:10/22/1980, 41 y.o., female Today's Date: 05/02/2022   PT End of Session - 05/02/22 0904     Visit Number 7    Number of Visits 8    Date for PT Re-Evaluation 04/18/22    PT Start Time 0900    PT Stop Time 0945    PT Time Calculation (min) 45 min              Past Medical History:  Diagnosis Date   Anxiety    Chronic bronchitis ([place])    History of chicken pox    Hypertension    Migraines    Pneumonia    Vitamin D insufficiency    Past Surgical History:  Procedure Laterality Date   CESAREAN SECTION     Patient Active Problem List   Diagnosis Date Noted   Low back pain 01/02/2022   Obesity (BMI 30.0-34.9) 07/21/2021   Essential hypertension 07/18/2021   Chronic bronchitis, unspecified chronic bronchitis type ([place]) 07/18/2021   Generalized anxiety disorder 07/18/2021   Vitamin D insufficiency 04/30/2015   Tobacco use 04/27/2015   REFERRING PROVIDER: Susa Day, MD  REFERRING DIAG: Low back pain, unspecified  Rationale for Evaluation and Treatment Rehabilitation  THERAPY DIAG:  Radiculopathy, lumbar region  Muscle weakness (generalized)  ONSET DATE: about 6 months ago  SUBJECTIVE:                                                                                                                                                                                           SUBJECTIVE STATEMENT:      Pt arrives for today's treatment reporting 5/10 low back pain.  Reports that she is going to have to have back surgery.  PERTINENT HISTORY:  HTN, smoker  PAIN:  Are you having pain? Yes: NPRS scale: 5/10 Pain location: low back and right lower extremity Pain description: constant numbness, sharp, ache (throughout RLE),  Aggravating factors: standing  Relieving factors: prednisone, medication, ice   PRECAUTIONS: None  WEIGHT BEARING RESTRICTIONS  No  OBJECTIVE:   SCREENING FOR RED FLAGS: Bowel or bladder incontinence: Yes: she has noticed some bladder changes, but she talked to her referring physician Spinal tumors: No Cauda equina syndrome: No Compression fracture: No Abdominal aneurysm: No  LUMBAR ROM:   Active  A/PROM  eval  Flexion 32  Extension 14  Right lateral flexion 25% limited with familiar low back pain  Left lateral flexion 25% limited  Right rotation 25% limited with familiar low back pain  Left rotation 25% limited   (Blank rows =  not tested)  LOWER EXTREMITY ROM: WFL for activities assessed    LOWER EXTREMITY MMT:    MMT Right eval Left eval  Hip flexion 4-/5 4/5  Hip extension    Hip abduction    Hip adduction    Hip internal rotation    Hip external rotation    Knee flexion 4-/5 with familiar low back pain 4-/5  Knee extension 4-/5 with familiar low back pain 4/5 with familiar low back pain  Ankle dorsiflexion 4-/5 4/5  Ankle plantarflexion    Ankle inversion    Ankle eversion     (Blank rows = not tested)  GAIT: Assistive device utilized: None Level of assistance: Complete Independence Comments: reduced gait speed and stride length  TODAY'S TREATMENT                       10/27           EXERCISE LOG  Exercise Repetitions and Resistance Comments  Nustep L3 x 15 mins   Draw-in  X15 hold 5 sec for TA activation   Supine bridging x20 reps 5 sec holds with TA activation   Lat pulldown  [employer] XTS x 35 reps    [employer] 25 reps x 3 sec hold   [employer] 30 reps x 3 sec hold   Supine marching with AB bracing  2x10  each leg hold 5 secs   SKTC    LTR     Blank cell = exercise not performed today   Reviewed body mechanics for ADL's with AB bracing  Manual Therapy Soft Tissue Mobilization: right lumbar, STW/M to right lumbar paraspinals and QL to decrease pain and tone with pt in left side-lying    PATIENT EDUCATION:  Education details: Reemphasizing proper AB bracing, movement  changes, posture for washing dishes and brushing teeth Person educated: Patient Education method: Explanation, demonstration Education comprehension: verbalized understanding   HOME EXERCISE PROGRAM:   ASSESSMENT:  CLINICAL IMPRESSION: Pt arrives for today's treatment session reporting 5/10 low back pain.  Pt reports that she saw her doc last Friday and that she is going to have to have back surgery.  Pt able to tolerate increased reps with all exercises today.  Pt instructed to continue performing abdominal bracing exercises between now and surgery in order to better prepare for recovery and post surgery therapy.  Pt has made minimal progress towards her goals at this time.  Pt encouraged to call the facility with any questions or concerns and when surgery has been scheduled.   OBJECTIVE IMPAIRMENTS Abnormal gait, decreased activity tolerance, decreased mobility, difficulty walking, decreased ROM, decreased strength, hypomobility, impaired sensation, impaired tone, postural dysfunction, and pain.   ACTIVITY LIMITATIONS lifting, bending, standing, transfers, locomotion level, and caring for others  PARTICIPATION LIMITATIONS: cleaning, shopping, community activity, and occupation  PERSONAL FACTORS Time since onset of injury/illness/exacerbation and 1-2 comorbidities: HTN and current smoker  are also affecting patient's functional outcome.   REHAB POTENTIAL: Fair    CLINICAL DECISION MAKING: Evolving/moderate complexity  EVALUATION COMPLEXITY: Moderate   GOALS: Goals reviewed with patient? No  LONG TERM GOALS: Target date: 04/15/2022  Patient will be independent with her HEP.  Baseline:  Goal status: MET  2.  Patient will be able to complete her daily activities without her familiar pain exceeding 5/10. Baseline: 04/11/22: depends on the day Goal status: IN PROGRESS  3.  Patient will be able to lift at least 10 pounds without being limited  by her familiar back pain.   Baseline: unable to lift more than 5 pounds currently Goal status: IN PROGRESS  4.  Patient will be able demonstrate her right lower extremity manual muscle testing at least equal to or greater than the left lower extremity.  Baseline:  Goal status: IN PROGRESS  PLAN: PT FREQUENCY: 2x/week  PT DURATION: 4 weeks  PLANNED INTERVENTIONS: Therapeutic exercises, Therapeutic activity, Neuromuscular re-education, Balance training, Gait training, Patient/Family education, Self Care, Joint mobilization, Stair training, Dry Needling, Electrical stimulation, Spinal mobilization, Cryotherapy, Moist heat, Taping, Traction, Manual therapy, and Re-evaluation.  PLAN FOR NEXT SESSION: nustep, lumbar stabilization, and modalities as needed   Kathrynn Ducking, PTA 05/02/2022, 10:06 AM   PHYSICAL THERAPY DISCHARGE SUMMARY  Visits from Start of Care: 7  Current functional level related to goals / functional outcomes: Patient was able to partially meet her goals for skilled physical therapy.    Remaining deficits: Pain and strength    Education / Equipment: HEP   Patient agrees to discharge. Patient goals were partially met. Patient is being discharged due to lack of progress.  Jacqulynn Cadet, PT, DPT

## 2022-05-06 ENCOUNTER — Ambulatory Visit (INDEPENDENT_AMBULATORY_CARE_PROVIDER_SITE_OTHER): Payer: BC Managed Care – PPO | Admitting: Family Medicine

## 2022-05-06 ENCOUNTER — Encounter: Payer: Self-pay | Admitting: Family Medicine

## 2022-05-06 VITALS — BP 98/67 | HR 87 | Temp 98.1°F | Ht 62.5 in | Wt 172.6 lb

## 2022-05-06 DIAGNOSIS — M545 Low back pain, unspecified: Secondary | ICD-10-CM

## 2022-05-06 DIAGNOSIS — I10 Essential (primary) hypertension: Secondary | ICD-10-CM

## 2022-05-06 DIAGNOSIS — G8929 Other chronic pain: Secondary | ICD-10-CM

## 2022-05-06 MED ORDER — VARENICLINE TARTRATE (STARTER) 0.5 MG X 11 & 1 MG X 42 PO TBPK: ORAL_TABLET | ORAL | 0 refills | Status: DC

## 2022-05-06 MED ORDER — VARENICLINE TARTRATE 1 MG PO TABS: 1.0000 mg | ORAL_TABLET | Freq: Two times a day (BID) | ORAL | 5 refills | Status: DC

## 2022-05-06 NOTE — Progress Notes (Signed)
Subjective:  Patient ID: Donna Bright, female    DOB: [birth year]  Age: 41 y.o. MRN: [id number]  CC: No chief complaint on file.   HPI Donna Bright presents for  follow-up of hypertension. Patient has no history of headache chest pain or shortness of breath or recent cough. Patient also denies symptoms of TIA such as focal numbness or weakness. Patient denies side effects from medication. States taking it regularly.  Pt. Planning Back surgery soon with [facility]. Here for surgical clearance. She is a smoker of 1 pack daily. Has seen pulmonary and diagnosed with chronic bronchitis. Has albuterol for prn use. She is interested in quitting. She understands her surgery would be smoother if she were not smoking. She is interested in trying chantix.    Depression in remission. Denies any symptoms.      05/06/2022    4:10 PM 04/14/2022    9:18 AM 07/25/2021    8:26 AM 07/18/2021   10:07 AM 05/15/2021   11:22 AM  Depression screen PHQ 2/9  Decreased Interest 0 0 0 0 0  Down, Depressed, Hopeless 0 0 0 0 0  PHQ - 2 Score 0 0 0 0 0  Altered sleeping  0 0 1 1  Tired, decreased energy  1 1 1 2   Change in appetite  0 0 0 0  Feeling bad or failure about yourself   0 0 0 0  Trouble concentrating  0 0 0 0  Moving slowly or fidgety/restless  0 0 0 0  Suicidal thoughts  0 0 0 0  PHQ-9 Score  1 1 2 3   Difficult doing work/chores  Not difficult at all Not difficult at all Not difficult at all       History Donna Bright has a past medical history of Anxiety, Chronic bronchitis (HCC), History of chicken pox, Hypertension, Migraines, Pneumonia, and Vitamin D insufficiency.   She has a past surgical history that includes Cesarean section.   Her family history includes Diabetes in her maternal grandfather; Emphysema in her maternal uncle; Stroke in her maternal grandfather.She reports that she has been smoking cigarettes. She has been smoking an average of .5 packs per day. She has quit using  smokeless tobacco. She reports that she does not drink alcohol and does not use drugs.  Current Outpatient Medications on File Prior to Visit  Medication Sig Dispense Refill   Albuterol Sulfate (PROAIR RESPICLICK) 108 (90 Base) MCG/ACT AEPB Inhale 2 puffs into the lungs every 4 (four) hours as needed (shortness of breath). 1 each 5   diclofenac (VOLTAREN) 75 MG EC tablet Take 1 tablet (75 mg total) by mouth 2 (two) times daily as needed. 60 tablet 2   escitalopram (LEXAPRO) 10 MG tablet Take 1 tablet (10 mg total) by mouth daily. 90 tablet 1   lisinopril (ZESTRIL) 10 MG tablet Take 1 tablet (10 mg total) by mouth daily. 30 tablet 2   Tiotropium Bromide-Olodaterol (STIOLTO RESPIMAT) 2.5-2.5 MCG/ACT AERS Inhale 1 puff into the lungs daily. 1 each 5   No current facility-administered medications on file prior to visit.    ROS Review of Systems  Constitutional: Negative.   HENT: Negative.    Eyes:  Negative for visual disturbance.  Respiratory:  Negative for shortness of breath.   Cardiovascular:  Negative for chest pain.  Gastrointestinal:  Negative for abdominal pain.  Musculoskeletal:  Negative for arthralgias.    Objective:  BP 98/67   Pulse 87   Temp 98.1  F (36.7 C)   Ht 5' 2.5" (1.588 m)   Wt 172 lb 9.6 oz (78.3 kg)   LMP 03/17/2022 (Approximate)   SpO2 95%   BMI 31.07 kg/m   BP Readings from Last 3 Encounters:  05/06/22 98/67  04/14/22 131/85  04/11/22 (!) 150/81    Wt Readings from Last 3 Encounters:  05/06/22 172 lb 9.6 oz (78.3 kg)  04/14/22 173 lb (78.5 kg)  08/01/21 172 lb 3.2 oz (78.1 kg)     Physical Exam Constitutional:      General: She is not in acute distress.    Appearance: She is well-developed.  HENT:     Head: Normocephalic and atraumatic.  Eyes:     Conjunctiva/sclera: Conjunctivae normal.     Pupils: Pupils are equal, round, and reactive to light.  Neck:     Thyroid: No thyromegaly.  Cardiovascular:     Rate and Rhythm: Normal rate and  regular rhythm.     Heart sounds: Normal heart sounds. No murmur heard. Pulmonary:     Effort: Pulmonary effort is normal. No respiratory distress.     Breath sounds: Normal breath sounds. No wheezing or rales.  Abdominal:     General: Bowel sounds are normal. There is no distension.     Palpations: Abdomen is soft.     Tenderness: There is no abdominal tenderness.  Musculoskeletal:        General: Normal range of motion.     Cervical back: Normal range of motion and neck supple.  Lymphadenopathy:     Cervical: No cervical adenopathy.  Skin:    General: Skin is warm and dry.  Neurological:     Mental Status: She is alert and oriented to person, place, and time.  Psychiatric:        Behavior: Behavior normal.        Thought Content: Thought content normal.        Judgment: Judgment normal.       Assessment & Plan:   There are no diagnoses linked to this encounter. Allergies as of 05/06/2022       Reactions   Sulfamethoxazole-trimethoprim Hives, Rash, Other (See Comments)        Medication List        Accurate as of May 06, 2022  5:15 PM. If you have any questions, ask your nurse or doctor.          diclofenac 75 MG EC tablet Commonly known as: VOLTAREN Take 1 tablet (75 mg total) by mouth 2 (two) times daily as needed.   escitalopram 10 MG tablet Commonly known as: LEXAPRO Take 1 tablet (10 mg total) by mouth daily.   lisinopril 10 MG tablet Commonly known as: ZESTRIL Take 1 tablet (10 mg total) by mouth daily.   ProAir RespiClick 478 (90 Base) MCG/ACT Aepb Generic drug: Albuterol Sulfate Inhale 2 puffs into the lungs every 4 (four) hours as needed (shortness of breath).   Stiolto Respimat 2.5-2.5 MCG/ACT Aers Generic drug: Tiotropium Bromide-Olodaterol Inhale 1 puff into the lungs daily.   varenicline 1 MG tablet Commonly known as: Chantix Continuing Month Pak Take 1 tablet (1 mg total) by mouth 2 (two) times daily. Started by: Claretta Fraise,  MD   Varenicline Tartrate (Starter) 0.5 MG X 11 & 1 MG X 42 Tbpk Commonly known as: Chantix Starting [employer] according to package directions Started by: Claretta Fraise, MD        Meds ordered this encounter  Medications  varenicline (CHANTIX CONTINUING MONTH PAK) 1 MG tablet    Sig: Take 1 tablet (1 mg total) by mouth 2 (two) times daily.    Dispense:  60 tablet    Refill:  5    Fill after pt. Finishes starter please   Varenicline Tartrate, Starter, (CHANTIX STARTING MONTH PAK) 0.5 MG X 11 & 1 MG X 42 TBPK    Sig: Use according to package directions    Dispense:  1 each    Refill:  0    Hold diclofenac for 1 week prior to surgery.  Follow-up: Return if symptoms worsen or fail to improve.  Mechele Claude, M.D.

## 2022-05-16 DIAGNOSIS — H6502 Acute serous otitis media, left ear: Secondary | ICD-10-CM | POA: Diagnosis not present

## 2022-05-16 DIAGNOSIS — H109 Unspecified conjunctivitis: Secondary | ICD-10-CM | POA: Diagnosis not present

## 2022-05-16 DIAGNOSIS — J209 Acute bronchitis, unspecified: Secondary | ICD-10-CM | POA: Diagnosis not present

## 2022-05-16 DIAGNOSIS — R070 Pain in throat: Secondary | ICD-10-CM | POA: Diagnosis not present

## 2022-05-27 ENCOUNTER — Ambulatory Visit: Payer: BC Managed Care – PPO | Admitting: Family Medicine

## 2022-06-04 ENCOUNTER — Ambulatory Visit: Payer: Self-pay | Admitting: Orthopedic Surgery

## 2022-06-04 DIAGNOSIS — M5126 Other intervertebral disc displacement, lumbar region: Secondary | ICD-10-CM

## 2022-06-18 NOTE — Pre-Procedure Instructions (Signed)
Surgical Instructions    Your procedure is scheduled on Thursday, December 21st.  Report to [facility] Main Entrance "A" at 08:30 A.M., then check in with the Admitting office.  Call this number if you have problems the morning of surgery:  [phone]   If you have any questions prior to your surgery date call [phone]: Open Monday-Friday 8am-4pm    Remember:  Do not eat after midnight the night before your surgery  You may drink clear liquids until 07:30 AM the morning of your surgery.   Clear liquids allowed are: Water, Non-Citrus Juices (without pulp), Carbonated Beverages, Clear Tea, Black Coffee Only (NO MILK, CREAM OR POWDERED CREAMER of any kind), and Gatorade.   Patient Instructions  The night before surgery:  No food after midnight. ONLY clear liquids after midnight  The day of surgery (if you do NOT have diabetes):  Drink ONE (1) Pre-Surgery Clear Ensure by 07:30 AM the morning of surgery. Drink in one sitting. Do not sip.  This drink was given to you during your hospital  pre-op appointment visit.  Nothing else to drink after completing the  Pre-Surgery Clear Ensure.          If you have questions, please contact your surgeon's office.     Take these medicines the morning of surgery with A SIP OF WATER   If needed: Albuterol Sulfate (PROAIR RESPICLICK) - if needed, bring with you on day of surgery cyclobenzaprine (FLEXERIL)     As of today, STOP taking any Aspirin (unless otherwise instructed by your surgeon) Aleve, Naproxen, Ibuprofen, Motrin, Advil, Goody's, BC's, all herbal medications, diclofenac (VOLTAREN), fish oil, and all vitamins.                     Do NOT Smoke (Tobacco/Vaping) for 24 hours prior to your procedure.  If you use a CPAP at night, you may bring your mask/headgear for your overnight stay.   Contacts, glasses, piercing's, hearing aid's, dentures or partials may not be worn into surgery, please bring cases for these belongings.     For patients admitted to the hospital, discharge time will be determined by your treatment team.   Patients discharged the day of surgery will not be allowed to drive home, and someone needs to stay with them for 24 hours.  SURGICAL WAITING ROOM VISITATION Patients having surgery or a procedure may have no more than 2 support people in the waiting area - these visitors may rotate.   Children under the age of 67 must have an adult with them who is not the patient. If the patient needs to stay at the hospital during part of their recovery, the visitor guidelines for inpatient rooms apply. Pre-op nurse will coordinate an appropriate time for 1 support person to accompany patient in pre-op.  This support person may not rotate.   Please refer to the [facility] website for the visitor guidelines for Inpatients (after your surgery is over and you are in a regular room).    Special instructions:   [place]- Preparing For Surgery  Before surgery, you can play an important role. Because skin is not sterile, your skin needs to be as free of germs as possible. You can reduce the number of germs on your skin by washing with CHG (chlorahexidine gluconate) Soap before surgery.  CHG is an antiseptic cleaner which kills germs and bonds with the skin to continue killing germs even after washing.    Oral Hygiene is also  important to reduce your risk of infection.  Remember - BRUSH YOUR TEETH THE MORNING OF SURGERY WITH YOUR REGULAR TOOTHPASTE  Please do not use if you have an allergy to CHG or antibacterial soaps. If your skin becomes reddened/irritated stop using the CHG.  Do not shave (including legs and underarms) for at least 48 hours prior to first CHG shower. It is OK to shave your face.  Please follow these instructions carefully.   Shower the NIGHT BEFORE SURGERY and the MORNING OF SURGERY  If you chose to wash your hair, wash your hair first as usual with your normal shampoo.  After you  shampoo, rinse your hair and body thoroughly to remove the shampoo.  Use CHG Soap as you would any other liquid soap. You can apply CHG directly to the skin and wash gently with a scrungie or a clean washcloth.   Apply the CHG Soap to your body ONLY FROM THE NECK DOWN.  Do not use on open wounds or open sores. Avoid contact with your eyes, ears, mouth and genitals (private parts). Wash Face and genitals (private parts)  with your normal soap.   Wash thoroughly, paying special attention to the area where your surgery will be performed.  Thoroughly rinse your body with warm water from the neck down.  DO NOT shower/wash with your normal soap after using and rinsing off the CHG Soap.  Pat yourself dry with a CLEAN TOWEL.  Wear CLEAN PAJAMAS to bed the night before surgery  Place CLEAN SHEETS on your bed the night before your surgery  DO NOT SLEEP WITH PETS.   Day of Surgery: Take a shower with CHG soap. Do not wear jewelry or makeup Do not wear lotions, powders, perfumes, or deodorant. Do not shave 48 hours prior to surgery.   Do not bring valuables to the hospital. [facility] is not responsible for any belongings or valuables. Do not wear nail polish, gel polish, artificial nails, or any other type of covering on natural nails (fingers and toes) If you have artificial nails or gel coating that need to be removed by a nail salon, please have this removed prior to surgery. Artificial nails or gel coating may interfere with anesthesia's ability to adequately monitor your vital signs. Wear Clean/Comfortable clothing the morning of surgery Remember to brush your teeth WITH YOUR REGULAR TOOTHPASTE.   Please read over the following fact sheets that you were given.    If you received a COVID test during your pre-op visit  it is requested that you wear a mask when out in public, stay away from anyone that may not be feeling well and notify your surgeon if you develop symptoms. If you have  been in contact with anyone that has tested positive in the last 10 days please notify you surgeon.

## 2022-06-19 ENCOUNTER — Inpatient Hospital Stay (HOSPITAL_COMMUNITY)
Admission: RE | Admit: 2022-06-19 | Discharge: 2022-06-19 | Disposition: A | Discharge status: Home / Self Care | Payer: BC Managed Care – PPO | Source: Ambulatory Visit

## 2022-06-19 NOTE — Progress Notes (Signed)
Spoke with pt about her missing her PAT appt today. She said she is re-scheduling her surgery. Pt instructed to call her surgeon's office ASAP to let them know that she needs to re-schedule.

## 2022-06-26 ENCOUNTER — Ambulatory Visit (HOSPITAL_COMMUNITY): Admission: RE | Admit: 2022-06-26 | Payer: BC Managed Care – PPO | Source: Ambulatory Visit | Admitting: Specialist

## 2022-06-26 SURGERY — HEMI-MICRODISCECTOMY LUMBAR LAMINECTOMY LEVEL 2
Anesthesia: Choice | Laterality: Bilateral

## 2022-07-07 ENCOUNTER — Other Ambulatory Visit: Payer: Self-pay | Admitting: Family Medicine

## 2022-07-07 DIAGNOSIS — I10 Essential (primary) hypertension: Secondary | ICD-10-CM

## 2022-08-15 ENCOUNTER — Other Ambulatory Visit: Payer: Self-pay | Admitting: Family Medicine

## 2022-09-04 ENCOUNTER — Encounter: Payer: Self-pay | Admitting: Radiology

## 2022-09-04 ENCOUNTER — Telehealth: Payer: Self-pay

## 2022-09-04 NOTE — Transitions of Care (Post Inpatient/ED Visit) (Signed)
   09/04/2022  Name: Donna Bright MRN: [id number] DOB: 08-Jul-1980  Today's TOC FU Call Status: Today's TOC FU Call Status:: Successful TOC FU Call Competed TOC FU Call Complete Date: 09/04/22  Transition Care Management Follow-up Telephone Call Date of Discharge: 09/03/22 Discharge Facility: Other ([facility]) Name of Other (Non[facility]) Discharge Facility: [facility] Type of Discharge: Emergency Department Reason for ED Visit: Other: (cough herniated disc) How have you been since you were released from the hospital?: Same Any questions or concerns?: No  Items Reviewed: Did you receive and understand the discharge instructions provided?: Yes Medications obtained and verified?: Yes (Medications Reviewed) Any new allergies since your discharge?: No Dietary orders reviewed?: NA Do you have support at home?: Yes People in Home: friend(s)  Home Care and Equipment/Supplies: Any new equipment or medical supplies ordered?: NA  Functional Questionnaire: Do you need assistance with bathing/showering or dressing?: No Do you need assistance with meal preparation?: No Do you need assistance with eating?: No Do you have difficulty managing or taking your medications?: No  Folllow up appointments reviewed: PCP Follow-up appointment confirmed?: No (patient needs to work out payment plan, she will call office) MD Provider Line [phone] Given: Yes [facility] Follow-up appointment confirmed?: No Reason Specialist Follow-Up Not Confirmed: Patient has Specialist Provider Number and will Call for Appointment Do you need transportation to your follow-up appointment?: No Do you understand care options if your condition(s) worsen?: Yes-patient verbalized understanding    [place], [place] Nurse Health Advisor Direct Dial [phone]

## 2022-10-06 ENCOUNTER — Other Ambulatory Visit: Payer: Self-pay | Admitting: Family Medicine

## 2022-10-06 DIAGNOSIS — I10 Essential (primary) hypertension: Secondary | ICD-10-CM

## 2022-10-27 ENCOUNTER — Other Ambulatory Visit: Payer: Self-pay | Admitting: Family Medicine

## 2022-10-27 DIAGNOSIS — F411 Generalized anxiety disorder: Secondary | ICD-10-CM

## 2022-11-13 ENCOUNTER — Other Ambulatory Visit: Payer: Self-pay | Admitting: Family Medicine

## 2022-11-13 DIAGNOSIS — I10 Essential (primary) hypertension: Secondary | ICD-10-CM

## 2022-11-20 ENCOUNTER — Ambulatory Visit: Payer: Self-pay | Admitting: Family Medicine

## 2022-11-24 ENCOUNTER — Encounter: Payer: Self-pay | Admitting: Family Medicine

## 2022-12-20 ENCOUNTER — Other Ambulatory Visit: Payer: Self-pay | Admitting: Family Medicine

## 2022-12-20 DIAGNOSIS — I10 Essential (primary) hypertension: Secondary | ICD-10-CM

## 2023-01-07 ENCOUNTER — Ambulatory Visit (INDEPENDENT_AMBULATORY_CARE_PROVIDER_SITE_OTHER): Payer: Self-pay | Admitting: Family Medicine

## 2023-01-07 ENCOUNTER — Encounter: Payer: Self-pay | Admitting: Family Medicine

## 2023-01-07 VITALS — BP 111/71 | HR 73 | Temp 98.2°F | Ht 62.5 in | Wt 167.0 lb

## 2023-01-07 DIAGNOSIS — M542 Cervicalgia: Secondary | ICD-10-CM

## 2023-01-07 DIAGNOSIS — M5412 Radiculopathy, cervical region: Secondary | ICD-10-CM | POA: Insufficient documentation

## 2023-01-07 DIAGNOSIS — G8929 Other chronic pain: Secondary | ICD-10-CM | POA: Insufficient documentation

## 2023-01-07 DIAGNOSIS — M5441 Lumbago with sciatica, right side: Secondary | ICD-10-CM

## 2023-01-07 MED ORDER — PREDNISONE 20 MG PO TABS
40.0000 mg | ORAL_TABLET | Freq: Every day | ORAL | 0 refills | Status: AC
Start: 2023-01-07 — End: 2023-01-12

## 2023-01-07 MED ORDER — METHYLPREDNISOLONE ACETATE 40 MG/ML IJ SUSP
60.0000 mg | Freq: Once | INTRAMUSCULAR | Status: AC
Start: 2023-01-07 — End: 2023-01-07
  Administered 2023-01-07: 60 mg via INTRAMUSCULAR

## 2023-01-07 NOTE — Addendum Note (Signed)
Addended by: Adella Hare B on: 01/07/2023 10:50 AM   Modules accepted: Orders

## 2023-01-07 NOTE — Progress Notes (Signed)
Subjective:  Patient ID: Donna Bright, female    DOB: [birth year], 42 y.o.   MRN: [id number]  Patient Care Team: Mechele Claude, MD as PCP - General (Family Medicine)   Chief Complaint:  Back Pain (UPPER AND LOWER)   HPI: Donna Bright is a 42 y.o. female presenting on 01/07/2023 for Back Pain (UPPER AND LOWER)   Pt presents today for worsening chronic back and neck pain. No new injuries. Was seen at [facility] ED last night. Imaging revealed degenerative changes but no acute findings. She was given a toradol injection and prescribed toradol. Has not picked up the toradol. She has not been able to see ortho in several months due to lack of insurance.   Back Pain This is a chronic problem. The problem has been waxing and waning since onset. The pain is present in the lumbar spine (cervical spine). The quality of the pain is described as shooting, stabbing and aching. The pain radiates to the right thigh (arms). The pain is moderate. The symptoms are aggravated by bending, standing, twisting and position. Associated symptoms include leg pain, numbness, paresthesias and tingling. Pertinent negatives include no abdominal pain, bladder incontinence, bowel incontinence, chest pain, dysuria, fever, headaches, paresis, pelvic pain, perianal numbness, weakness or weight loss.     Relevant past medical, surgical, family, and social history reviewed and updated as indicated.  Allergies and medications reviewed and updated. Data reviewed: Chart in Epic.   Past Medical History:  Diagnosis Date   Anxiety    Chronic bronchitis (HCC)    History of chicken pox    Hypertension    Migraines    Pneumonia    Vitamin D insufficiency     Past Surgical History:  Procedure Laterality Date   CESAREAN SECTION      Social History   Socioeconomic History   Marital status: Divorced    Spouse name: Not on file   Number of children: Not on file   Years of education: Not on file   Highest education  level: Not on file  Occupational History   Not on file  Tobacco Use   Smoking status: Some Days    Packs/day: .5    Types: Cigarettes   Smokeless tobacco: Former  Substance and Sexual Activity   Alcohol use: No    Alcohol/week: 0.0 standard drinks of alcohol   Drug use: No   Sexual activity: Never    Birth control/protection: None  Other Topics Concern   Not on file  Social History Narrative   Separated. Donna Bright 41 year old female.    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    Wears seatbelts. Smoke alarm in the home.    Lives with roommate and Bright.    Social Determinants of Health   Financial Resource Strain: Not on file  Food Insecurity: Not on file  Transportation Needs: Not on file  Physical Activity: Not on file  Stress: Not on file  Social Connections: Not on file  Intimate Partner Violence: Not on file    Outpatient Encounter Medications as of 01/07/2023  Medication Sig   albuterol (VENTOLIN HFA) 108 (90 Base) MCG/ACT inhaler INHALE 2 PUFFS EVERY 4 HOURS AS NEEDED FOR SHORTNESS OF BREATH, WHEEZING.   cyclobenzaprine (FLEXERIL) 5 MG tablet Take 5 mg by mouth 3 (three) times daily as needed for muscle spasms.   diclofenac (VOLTAREN) 75 MG EC tablet Take 1 tablet (75 mg total) by mouth  2 (two) times daily as needed. (Patient taking differently: Take 75 mg by mouth 2 (two) times daily as needed (arthritis pain.).)   escitalopram (LEXAPRO) 10 MG tablet TAKE ONE TABLET BY MOUTH DAILY.   lisinopril (ZESTRIL) 10 MG tablet Take 1 tablet (10 mg total) by mouth daily. (NEEDS TO BE SEEN BEFORE NEXT REFILL)   predniSONE (DELTASONE) 20 MG tablet Take 2 tablets (40 mg total) by mouth daily with breakfast for 5 days. 2 po daily for 5 days   Tiotropium Bromide-Olodaterol (STIOLTO RESPIMAT) 2.5-2.5 MCG/ACT AERS Inhale 1 puff into the lungs daily.   varenicline (CHANTIX CONTINUING MONTH PAK) 1 MG tablet Take 1 tablet (1 mg total) by mouth 2 (two)  times daily.   Varenicline Tartrate, Starter, (CHANTIX STARTING MONTH PAK) 0.5 MG X 11 & 1 MG X 42 TBPK Use according to package directions   No facility-administered encounter medications on file as of 01/07/2023.    Allergies  Allergen Reactions   Septra [Sulfamethoxazole-Trimethoprim] Hives, Rash and Other (See Comments)    Review of Systems  Constitutional:  Positive for activity change. Negative for appetite change, chills, diaphoresis, fatigue, fever, unexpected weight change and weight loss.  HENT: Negative.    Eyes: Negative.  Negative for photophobia and visual disturbance.  Respiratory:  Negative for cough, chest tightness and shortness of breath.   Cardiovascular:  Negative for chest pain, palpitations and leg swelling.  Gastrointestinal:  Negative for abdominal pain, blood in stool, bowel incontinence, constipation, diarrhea, nausea and vomiting.  Endocrine: Negative.  Negative for polydipsia, polyphagia and polyuria.  Genitourinary:  Negative for bladder incontinence, decreased urine volume, difficulty urinating, dysuria, frequency, pelvic pain and urgency.  Musculoskeletal:  Positive for back pain, myalgias and neck pain. Negative for arthralgias, gait problem, joint swelling and neck stiffness.  Skin: Negative.   Allergic/Immunologic: Negative.   Neurological:  Positive for tingling, numbness and paresthesias. Negative for dizziness, weakness and headaches.  Hematological: Negative.   Psychiatric/Behavioral:  Negative for confusion, hallucinations, sleep disturbance and suicidal ideas.   All other systems reviewed and are negative.       Objective:  BP 111/71   Pulse 73   Temp 98.2 F (36.8 C)   Ht 5' 2.5" (1.588 m)   Wt 167 lb (75.8 kg)   SpO2 96%   BMI 30.06 kg/m    Wt Readings from Last 3 Encounters:  01/07/23 167 lb (75.8 kg)  05/06/22 172 lb 9.6 oz (78.3 kg)  04/14/22 173 lb (78.5 kg)    Physical Exam Vitals and nursing note reviewed.   Constitutional:      General: She is not in acute distress.    Appearance: Normal appearance. She is well-developed and well-groomed. She is obese. She is not ill-appearing, toxic-appearing or diaphoretic.  HENT:     Head: Normocephalic and atraumatic.     Jaw: There is normal jaw occlusion.     Right Ear: Hearing normal.     Left Ear: Hearing normal.     Nose: Nose normal.     Mouth/Throat:     Lips: Pink.     Mouth: Mucous membranes are moist.     Pharynx: Oropharynx is clear. Uvula midline.  Eyes:     General: Lids are normal.     Extraocular Movements: Extraocular movements intact.     Conjunctiva/sclera: Conjunctivae normal.     Pupils: Pupils are equal, round, and reactive to light.  Neck:     Thyroid: No thyroid mass, thyromegaly or thyroid  tenderness.     Vascular: No carotid bruit or JVD.     Trachea: Trachea and phonation normal.  Cardiovascular:     Rate and Rhythm: Normal rate and regular rhythm.     Chest Wall: PMI is not displaced.     Pulses: Normal pulses.     Heart sounds: Normal heart sounds. No murmur heard.    No friction rub. No gallop.  Pulmonary:     Effort: Pulmonary effort is normal. No respiratory distress.     Breath sounds: Normal breath sounds. No wheezing.  Abdominal:     General: Bowel sounds are normal. There is no distension or abdominal bruit.     Palpations: Abdomen is soft. There is no hepatomegaly or splenomegaly.     Tenderness: There is no abdominal tenderness. There is no right CVA tenderness or left CVA tenderness.     Hernia: No hernia is present.  Musculoskeletal:     Right shoulder: Normal.     Left shoulder: Normal.     Cervical back: Neck supple. Tenderness present. No swelling, edema, deformity, erythema, signs of trauma, lacerations, rigidity, spasms, torticollis, bony tenderness or crepitus. No pain with movement. Decreased range of motion.     Thoracic back: Normal.     Lumbar back: Tenderness present. No swelling, edema,  deformity, signs of trauma, lacerations, spasms or bony tenderness. Decreased range of motion. Positive right straight leg raise test. Negative left straight leg raise test. No scoliosis.     Right hip: Normal.     Left hip: Normal.     Right lower leg: No edema.     Left lower leg: No edema.  Lymphadenopathy:     Cervical: No cervical adenopathy.  Skin:    General: Skin is warm and dry.     Capillary Refill: Capillary refill takes less than 2 seconds.     Coloration: Skin is not cyanotic, jaundiced or pale.     Findings: No rash.  Neurological:     General: No focal deficit present.     Mental Status: She is alert and oriented to person, place, and time.     Sensory: Sensation is intact.     Motor: Motor function is intact.     Coordination: Coordination is intact.     Gait: Gait abnormal (antalgic).     Deep Tendon Reflexes: Reflexes are normal and symmetric.  Psychiatric:        Attention and Perception: Attention and perception normal.        Mood and Affect: Mood and affect normal.        Speech: Speech normal.        Behavior: Behavior normal. Behavior is cooperative.        Thought Content: Thought content normal.        Cognition and Memory: Cognition and memory normal.        Judgment: Judgment normal.     Results for orders placed or performed in visit on 08/01/21  Pulmonary function test  Result Value Ref Range   FVC-Pre 2.56 L   FVC-%Pred-Pre 72 %   FVC-Post 2.41 L   FVC-%Pred-Post 68 %   FVC-%Change-Post -5 %   FEV1-Pre 2.12 L   FEV1-%Pred-Pre 73 %   FEV1-Post 2.04 L   FEV1-%Pred-Post 70 %   FEV1-%Change-Post -4 %   FEV6-Pre 2.56 L   FEV6-%Pred-Pre 74 %   FEV6-Post 2.41 L   FEV6-%Pred-Post 69 %   FEV6-%Change-Post -5 %   Pre  FEV1/FVC ratio 83 %   FEV1FVC-%Pred-Pre 100 %   Post FEV1/FVC ratio 84 %   FEV1FVC-%Change-Post 1 %   Pre FEV6/FVC Ratio 100 %   FEV6FVC-%Pred-Pre 101 %   Post FEV6/FVC ratio 100 %   FEV6FVC-%Pred-Post 101 %   FEF 25-75 Pre  2.30 L/sec   FEF2575-%Pred-Pre 75 %   FEF 25-75 Post 1.96 L/sec   FEF2575-%Pred-Post 64 %   FEF2575-%Change-Post -14 %   RV 1.72 L   RV % pred 113 %   TLC 4.23 L   TLC % pred 87 %   DLCO unc 14.54 ml/min/mmHg   DLCO unc % pred 69 %   DLCO cor 14.58 ml/min/mmHg   DLCO cor % pred 70 %   DL/VA 1.61 ml/min/mmHg/L   DL/VA % pred 89 %       Pertinent labs & imaging results that were available during my care of the patient were reviewed by me and considered in my medical decision making.  Assessment & Plan:  Adrieanna was seen today for back pain.  Diagnoses and all orders for this visit:  Chronic bilateral low back pain with right-sided sciatica Chronic neck pain Cervical radiculopathy No red flags concerning for cauda equina syndrome. Will burst with steroids, DepoMedrol given in office. Aware to hold off on Toradol. Follow up with ortho. Report new, worsening, or persistent symptoms. Aware of red flags.  -     predniSONE (DELTASONE) 20 MG tablet; Take 2 tablets (40 mg total) by mouth daily with breakfast for 5 days. 2 po daily for 5 days     Continue all other maintenance medications.  Follow up plan: Return if symptoms worsen or fail to improve.   Continue healthy lifestyle choices, including diet (rich in fruits, vegetables, and lean proteins, and low in salt and simple carbohydrates) and exercise (at least 30 minutes of moderate physical activity daily).  Educational handout given for cervical radiculopathy   The above assessment and management plan was discussed with the patient. The patient verbalized understanding of and has agreed to the management plan. Patient is aware to call the clinic if they develop any new symptoms or if symptoms persist or worsen. Patient is aware when to return to the clinic for a follow-up visit. Patient educated on when it is appropriate to go to the emergency department.   Kari Baars, FNP-C [facility] [phone]

## 2023-01-22 ENCOUNTER — Other Ambulatory Visit: Payer: Self-pay | Admitting: Family Medicine

## 2023-01-22 DIAGNOSIS — I10 Essential (primary) hypertension: Secondary | ICD-10-CM

## 2023-02-05 ENCOUNTER — Other Ambulatory Visit: Payer: Self-pay | Admitting: Family Medicine

## 2023-02-05 DIAGNOSIS — F411 Generalized anxiety disorder: Secondary | ICD-10-CM

## 2023-02-19 ENCOUNTER — Other Ambulatory Visit: Payer: Self-pay | Admitting: Family Medicine

## 2023-02-19 DIAGNOSIS — I10 Essential (primary) hypertension: Secondary | ICD-10-CM

## 2023-02-25 ENCOUNTER — Telehealth: Payer: Self-pay | Admitting: Family Medicine

## 2023-02-25 NOTE — Telephone Encounter (Signed)
Pt. Needs to be seen for this. Thanks, WS 

## 2023-02-25 NOTE — Telephone Encounter (Signed)
NA / NVM 

## 2023-03-10 ENCOUNTER — Other Ambulatory Visit: Payer: Self-pay | Admitting: Family Medicine

## 2023-03-10 ENCOUNTER — Telehealth: Payer: Self-pay | Admitting: Family Medicine

## 2023-03-10 DIAGNOSIS — F411 Generalized anxiety disorder: Secondary | ICD-10-CM

## 2023-03-10 NOTE — Telephone Encounter (Signed)
NA/NVM setup, refill done earlier today via electronic request

## 2023-03-10 NOTE — Telephone Encounter (Signed)
  Prescription Request  03/10/2023  Is this a "Controlled Substance" medicine? YES  Have you seen your PCP in the last 2 weeks? NO  If YES, route message to pool  -  If NO, patient needs to be scheduled for appointment.  What is the name of the medication or equipment? EScitalopram 10 mg Patient has appt 9-11 with [facility]  Have you contacted your pharmacy to request a refill? yes   Which pharmacy would you like this sent to? [facility]   Patient notified that their request is being sent to the clinical staff for review and that they should receive a response within 2 business days.

## 2023-03-18 ENCOUNTER — Encounter: Payer: Self-pay | Admitting: Family Medicine

## 2023-03-18 ENCOUNTER — Ambulatory Visit (INDEPENDENT_AMBULATORY_CARE_PROVIDER_SITE_OTHER): Payer: Self-pay | Admitting: Family Medicine

## 2023-03-18 VITALS — BP 114/69 | HR 82 | Temp 97.9°F | Ht 62.5 in | Wt 166.0 lb

## 2023-03-18 DIAGNOSIS — M79641 Pain in right hand: Secondary | ICD-10-CM

## 2023-03-18 DIAGNOSIS — I10 Essential (primary) hypertension: Secondary | ICD-10-CM

## 2023-03-18 DIAGNOSIS — M545 Low back pain, unspecified: Secondary | ICD-10-CM

## 2023-03-18 DIAGNOSIS — M79642 Pain in left hand: Secondary | ICD-10-CM

## 2023-03-18 DIAGNOSIS — G8929 Other chronic pain: Secondary | ICD-10-CM

## 2023-03-18 DIAGNOSIS — F411 Generalized anxiety disorder: Secondary | ICD-10-CM

## 2023-03-18 DIAGNOSIS — D699 Hemorrhagic condition, unspecified: Secondary | ICD-10-CM

## 2023-03-18 LAB — URINALYSIS
Bilirubin, UA: NEGATIVE
Glucose, UA: NEGATIVE
Ketones, UA: NEGATIVE
Leukocytes,UA: NEGATIVE
Nitrite, UA: NEGATIVE
Protein,UA: NEGATIVE
RBC, UA: NEGATIVE
Specific Gravity, UA: 1.02 (ref 1.005–1.030)
Urobilinogen, Ur: 0.2 mg/dL (ref 0.2–1.0)
pH, UA: 7 (ref 5.0–7.5)

## 2023-03-18 MED ORDER — DICLOFENAC SODIUM 75 MG PO TBEC
75.0000 mg | DELAYED_RELEASE_TABLET | Freq: Two times a day (BID) | ORAL | 2 refills | Status: DC
Start: 2023-03-18 — End: 2024-03-21

## 2023-03-18 MED ORDER — ALBUTEROL SULFATE HFA 108 (90 BASE) MCG/ACT IN AERS: 2.0000 | INHALATION_SPRAY | RESPIRATORY_TRACT | 5 refills | Status: DC | PRN

## 2023-03-18 MED ORDER — PREDNISONE 20 MG PO TABS: ORAL_TABLET | ORAL | 0 refills | Status: DC

## 2023-03-18 MED ORDER — ESCITALOPRAM OXALATE 10 MG PO TABS
10.0000 mg | ORAL_TABLET | Freq: Every day | ORAL | 0 refills | Status: DC
Start: 2023-03-18 — End: 2023-05-07

## 2023-03-18 MED ORDER — TIZANIDINE HCL 4 MG PO TABS: 4.0000 mg | ORAL_TABLET | Freq: Four times a day (QID) | ORAL | 1 refills | Status: DC | PRN

## 2023-03-18 NOTE — Patient Instructions (Signed)

## 2023-03-18 NOTE — Progress Notes (Signed)
Subjective:  Patient ID: Donna Bright, female    DOB: Apr 20, 1981  Age: 42 y.o. MRN: [id number]  CC: Medical Management of Chronic Issues   HPI Donna Bright presents for back pain. L5 herniated disc. Onset 1 year ago. Lost insurance because she was laid off. Hurts to stand. Pain starts at lower back radiates to both thighs. Tramadol given at [facility], but made her to sleepy.  Pain currently 8/10 radiates to thighs in the back. Makes her feel like legs will come out from under her. Ibuprofen takes off the edge for a few hours.  Feels like her sugar drops at times. Cuts seem to be bleeding more.    presents for  follow-up of hypertension. Patient has no history of headache Occasional chest pain. Described as tightness. No radiation.   No shortness of breath or recent cough. Patient also denies symptoms of TIA such as focal numbness or weakness. Patient denies side effects from medication. States taking it regularly.      03/18/2023   10:45 AM 05/06/2022    4:10 PM 04/14/2022    9:18 AM  Depression screen PHQ 2/9  Decreased Interest 0 0 0  Down, Depressed, Hopeless 0 0 0  PHQ - 2 Score 0 0 0  Altered sleeping   0  Tired, decreased energy   1  Change in appetite   0  Feeling bad or failure about yourself    0  Trouble concentrating   0  Moving slowly or fidgety/restless   0  Suicidal thoughts   0  PHQ-9 Score   1  Difficult doing work/chores   Not difficult at all    History Donna Bright has a past medical history of Anxiety, Chronic bronchitis (HCC), History of chicken pox, Hypertension, Migraines, Pneumonia, and Vitamin D insufficiency.   She has a past surgical history that includes Cesarean section.   Her family history includes Diabetes in her maternal grandfather; Emphysema in her maternal uncle; Stroke in her maternal grandfather.She reports that she has been smoking cigarettes. She has quit using smokeless tobacco. She reports that she does not drink alcohol and does not use  drugs.    ROS Review of Systems  Constitutional: Negative.   HENT: Negative.    Eyes:  Negative for visual disturbance.  Respiratory:  Negative for shortness of breath.   Cardiovascular:  Negative for chest pain.  Gastrointestinal:  Negative for abdominal pain.  Musculoskeletal:  Negative for arthralgias.    Objective:  BP 114/69   Pulse 82   Temp 97.9 F (36.6 C)   Ht 5' 2.5" (1.588 m)   Wt 166 lb (75.3 kg)   SpO2 97%   BMI 29.88 kg/m   BP Readings from Last 3 Encounters:  03/18/23 114/69  01/07/23 111/71  05/06/22 98/67    Wt Readings from Last 3 Encounters:  03/18/23 166 lb (75.3 kg)  01/07/23 167 lb (75.8 kg)  05/06/22 172 lb 9.6 oz (78.3 kg)     Physical Exam Constitutional:      General: She is not in acute distress.    Appearance: She is well-developed.  HENT:     Head: Normocephalic and atraumatic.  Eyes:     Conjunctiva/sclera: Conjunctivae normal.     Pupils: Pupils are equal, round, and reactive to light.  Neck:     Thyroid: No thyromegaly.  Cardiovascular:     Rate and Rhythm: Normal rate and regular rhythm.     Heart sounds: Normal heart sounds.  No murmur heard. Pulmonary:     Effort: Pulmonary effort is normal. No respiratory distress.     Breath sounds: Normal breath sounds. No wheezing or rales.  Abdominal:     General: Bowel sounds are normal. There is no distension.     Palpations: Abdomen is soft.     Tenderness: There is no abdominal tenderness.  Musculoskeletal:        General: Normal range of motion.     Cervical back: Normal range of motion and neck supple.  Lymphadenopathy:     Cervical: No cervical adenopathy.  Skin:    General: Skin is warm and dry.  Neurological:     Mental Status: She is alert and oriented to person, place, and time.  Psychiatric:        Behavior: Behavior normal.        Thought Content: Thought content normal.        Judgment: Judgment normal.       Assessment & Plan:   Javonne was seen today  for medical management of chronic issues.  Diagnoses and all orders for this visit:  Essential hypertension -     CBC with Differential/Platelet -     CMP14+EGFR -     Lipid panel -     Urinalysis  Chronic low back pain, unspecified back pain laterality, unspecified whether sciatica present -     CBC with Differential/Platelet -     CMP14+EGFR -     Lipid panel -     Urinalysis  Bleeds easily (HCC) -     CBC with Differential/Platelet -     CMP14+EGFR -     Lipid panel  Bilateral hand pain -     diclofenac (VOLTAREN) 75 MG EC tablet; Take 1 tablet (75 mg total) by mouth 2 (two) times daily.  Generalized anxiety disorder -     escitalopram (LEXAPRO) 10 MG tablet; Take 1 tablet (10 mg total) by mouth daily.  Other orders -     predniSONE (DELTASONE) 20 MG tablet; One twice daily with food for 2 weeks. Then one daily for 2 weeks -     tiZANidine (ZANAFLEX) 4 MG tablet; Take 1 tablet (4 mg total) by mouth every 6 (six) hours as needed for muscle spasms. -     albuterol (VENTOLIN HFA) 108 (90 Base) MCG/ACT inhaler; Inhale 2 puffs into the lungs every 4 (four) hours as needed for wheezing or shortness of breath.       I have discontinued Tarry Krausz. Donna Bright's cyclobenzaprine. I have also changed her diclofenac, escitalopram, and albuterol. Additionally, I am having her start on predniSONE and tiZANidine. Lastly, I am having her maintain her Stiolto Respimat, varenicline, Varenicline Tartrate (Starter), and lisinopril.  Allergies as of 03/18/2023       Reactions   Septra [sulfamethoxazole-trimethoprim] Hives, Rash, Other (See Comments)        Medication List        Accurate as of March 18, 2023 10:17 PM. If you have any questions, ask your nurse or doctor.          STOP taking these medications    cyclobenzaprine 5 MG tablet Commonly known as: FLEXERIL Stopped by: Eissa Buchberger       TAKE these medications    albuterol 108 (90 Base) MCG/ACT  inhaler Commonly known as: VENTOLIN HFA Inhale 2 puffs into the lungs every 4 (four) hours as needed for wheezing or shortness of breath. What changed: See the new instructions.  Changed by: Viera Okonski   diclofenac 75 MG EC tablet Commonly known as: VOLTAREN Take 1 tablet (75 mg total) by mouth 2 (two) times daily. What changed:  when to take this reasons to take this Changed by: Ilse Billman   escitalopram 10 MG tablet Commonly known as: LEXAPRO Take 1 tablet (10 mg total) by mouth daily.   lisinopril 10 MG tablet Commonly known as: ZESTRIL Take 1 tablet (10 mg total) by mouth daily. **NEEDS TO BE SEEN BEFORE NEXT REFILL**   predniSONE 20 MG tablet Commonly known as: DELTASONE One twice daily with food for 2 weeks. Then one daily for 2 weeks Started by: Nicholai Willette   Stiolto Respimat 2.5-2.5 MCG/ACT Aers Generic drug: Tiotropium Bromide-Olodaterol Inhale 1 puff into the lungs daily.   tiZANidine 4 MG tablet Commonly known as: ZANAFLEX Take 1 tablet (4 mg total) by mouth every 6 (six) hours as needed for muscle spasms. Started by: Broadus John Indigo Chaddock   varenicline 1 MG tablet Commonly known as: Restaurant manager, fast food Pak Take 1 tablet (1 mg total) by mouth 2 (two) times daily.   Varenicline Tartrate (Starter) 0.5 MG X 11 & 1 MG X 42 Tbpk Commonly known as: Chantix Starting [employer] Use according to package directions         Follow-up: No follow-ups on file.  Mechele Claude, M.D.

## 2023-03-19 LAB — CMP14+EGFR
ALT: 16 IU/L (ref 0–32)
AST: 19 IU/L (ref 0–40)
Albumin: 4.2 g/dL (ref 3.9–4.9)
Alkaline Phosphatase: 83 IU/L (ref 44–121)
BUN/Creatinine Ratio: 11 (ref 9–23)
BUN: 9 mg/dL (ref 6–24)
Bilirubin Total: 0.3 mg/dL (ref 0.0–1.2)
CO2: 23 mmol/L (ref 20–29)
Calcium: 9.2 mg/dL (ref 8.7–10.2)
Chloride: 101 mmol/L (ref 96–106)
Creatinine, Ser: 0.81 mg/dL (ref 0.57–1.00)
Globulin, Total: 3.2 g/dL (ref 1.5–4.5)
Glucose: 78 mg/dL (ref 70–99)
Potassium: 4.3 mmol/L (ref 3.5–5.2)
Sodium: 138 mmol/L (ref 134–144)
Total Protein: 7.4 g/dL (ref 6.0–8.5)
eGFR: 93 mL/min/{1.73_m2} (ref >59)

## 2023-03-19 LAB — CBC WITH DIFFERENTIAL/PLATELET
Basophils Absolute: 0.1 10*3/uL (ref 0.0–0.2)
Basos: 1 %
EOS (ABSOLUTE): 0.1 10*3/uL (ref 0.0–0.4)
Eos: 1 %
Hematocrit: 39.8 % (ref 34.0–46.6)
Hemoglobin: 12.6 g/dL (ref 11.1–15.9)
Immature Grans (Abs): 0 10*3/uL (ref 0.0–0.1)
Immature Granulocytes: 0 %
Lymphocytes Absolute: 2.6 10*3/uL (ref 0.7–3.1)
Lymphs: 34 %
MCH: 27.9 pg (ref 26.6–33.0)
MCHC: 31.7 g/dL (ref 31.5–35.7)
MCV: 88 fL (ref 79–97)
Monocytes Absolute: 0.5 10*3/uL (ref 0.1–0.9)
Monocytes: 7 %
Neutrophils Absolute: 4.3 10*3/uL (ref 1.4–7.0)
Neutrophils: 57 %
Platelets: 349 10*3/uL (ref 150–450)
RBC: 4.52 x10E6/uL (ref 3.77–5.28)
RDW: 15.1 % (ref 11.7–15.4)
WBC: 7.6 10*3/uL (ref 3.4–10.8)

## 2023-03-19 LAB — LIPID PANEL
Chol/HDL Ratio: 5 ratio — ABNORMAL HIGH (ref 0.0–4.4)
Cholesterol, Total: 174 mg/dL (ref 100–199)
HDL: 35 mg/dL — ABNORMAL LOW (ref >39)
LDL Chol Calc (NIH): 105 mg/dL — ABNORMAL HIGH (ref 0–99)
Triglycerides: 195 mg/dL — ABNORMAL HIGH (ref 0–149)
VLDL Cholesterol Cal: 34 mg/dL (ref 5–40)

## 2023-03-23 ENCOUNTER — Other Ambulatory Visit: Payer: Self-pay | Admitting: Family Medicine

## 2023-03-23 DIAGNOSIS — I10 Essential (primary) hypertension: Secondary | ICD-10-CM

## 2023-04-29 ENCOUNTER — Other Ambulatory Visit: Payer: Self-pay | Admitting: Family Medicine

## 2023-05-07 ENCOUNTER — Other Ambulatory Visit: Payer: Self-pay | Admitting: Family Medicine

## 2023-05-07 DIAGNOSIS — F411 Generalized anxiety disorder: Secondary | ICD-10-CM

## 2023-05-18 DIAGNOSIS — N926 Irregular menstruation, unspecified: Secondary | ICD-10-CM | POA: Insufficient documentation

## 2023-05-24 DIAGNOSIS — N611 Abscess of the breast and nipple: Secondary | ICD-10-CM | POA: Insufficient documentation

## 2023-06-19 DIAGNOSIS — M51369 Other intervertebral disc degeneration, lumbar region without mention of lumbar back pain or lower extremity pain: Secondary | ICD-10-CM | POA: Insufficient documentation

## 2023-08-28 ENCOUNTER — Encounter: Payer: Self-pay | Admitting: Internal Medicine

## 2023-09-10 ENCOUNTER — Other Ambulatory Visit: Payer: Self-pay | Admitting: Internal Medicine

## 2023-09-15 ENCOUNTER — Ambulatory Visit: Payer: Self-pay | Admitting: Family Medicine

## 2023-09-21 IMAGING — DX DG CHEST 2V
2 series · 2 of 2 positions shown · non-contrast
Comparison: X-ray chest 10/31/2007.

CLINICAL DATA: Shortness of breath.

EXAM:
CHEST - 2 VIEW

[chest pa]
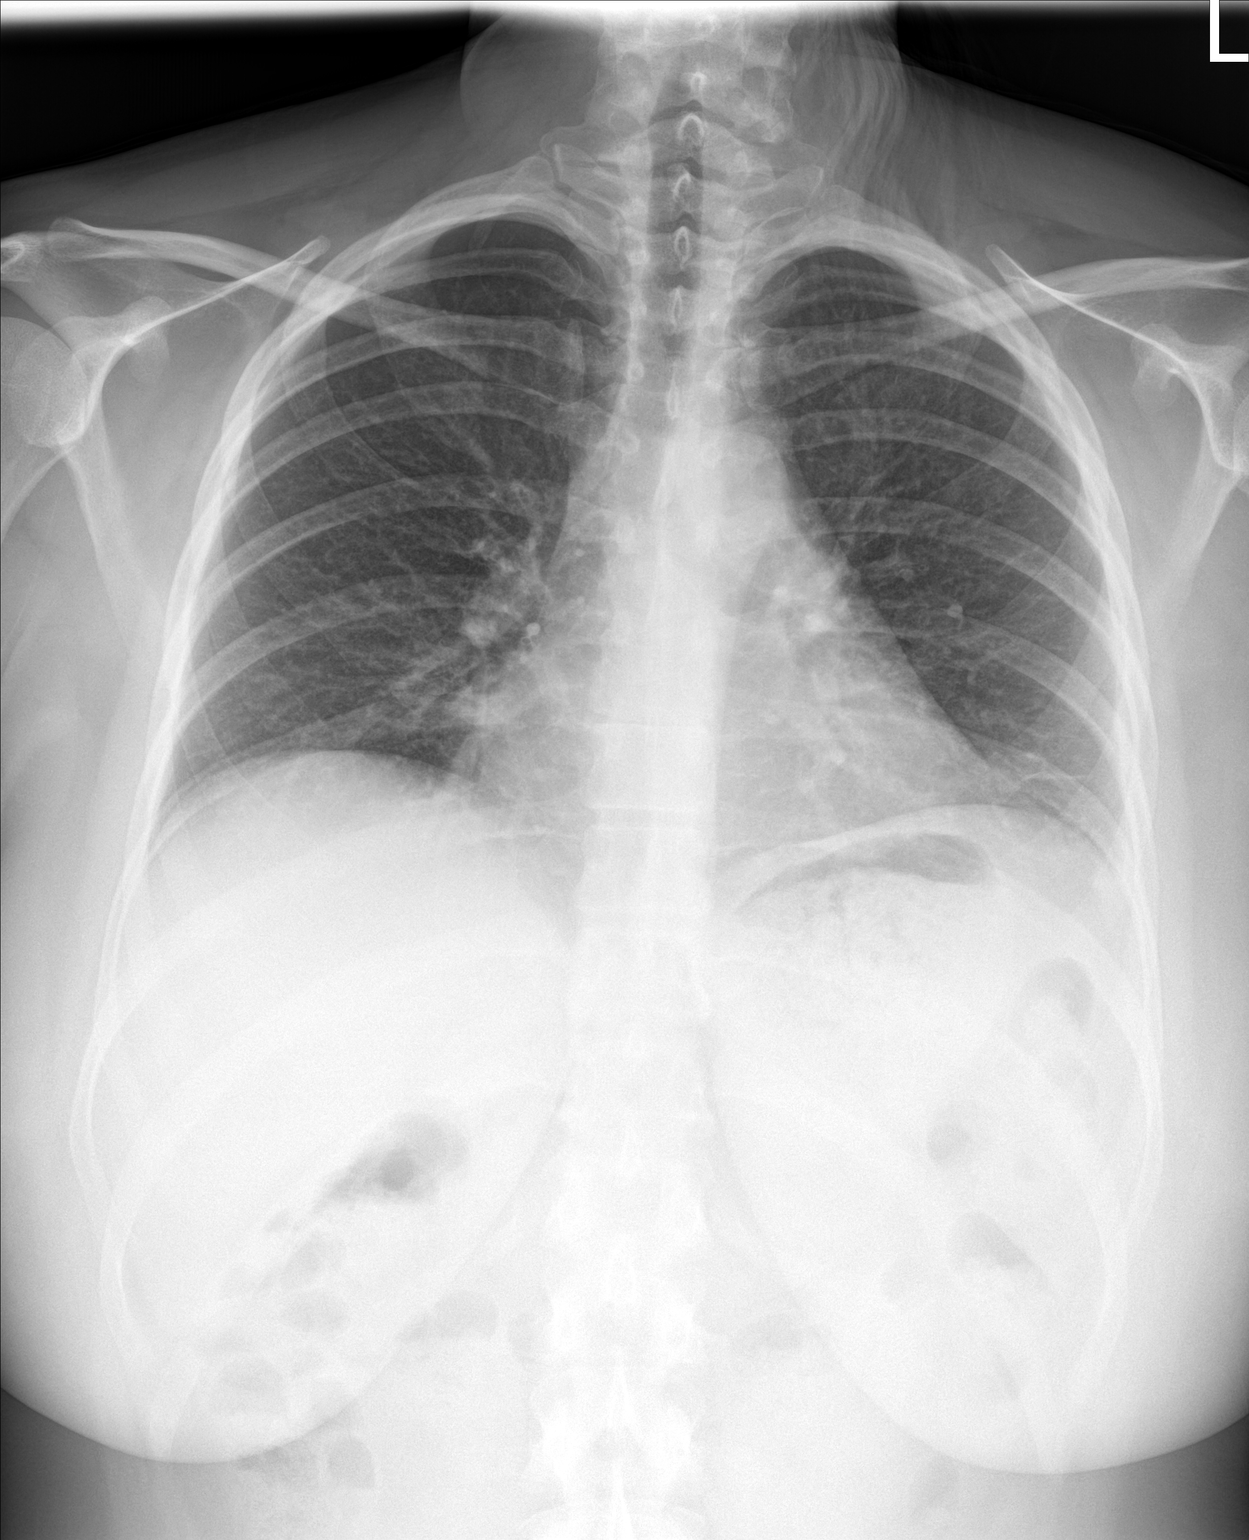

[chest lat]
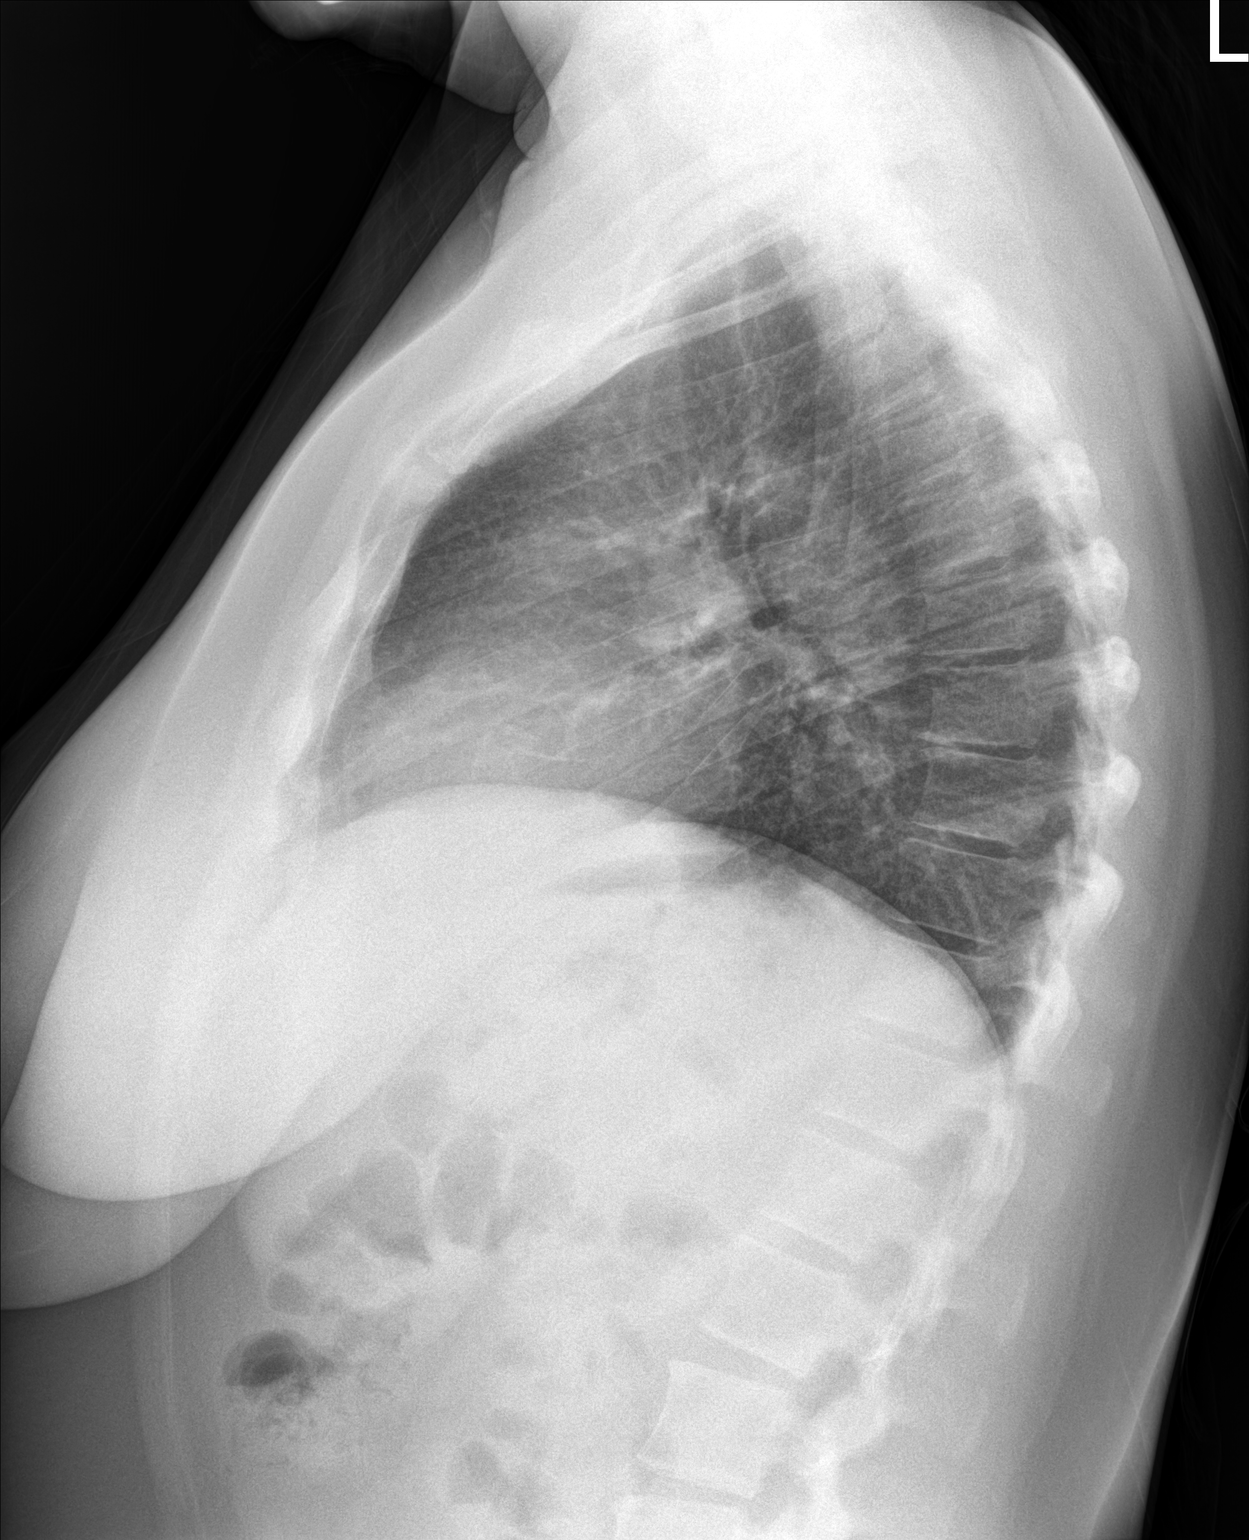

[2 of 2 positions shown; findings below may reference images not displayed]

FINDINGS: The heart size and mediastinal contours are within normal limits.
Both lungs are clear. The visualized skeletal structures are
unremarkable.
IMPRESSION: No active cardiopulmonary disease.

## 2023-10-21 ENCOUNTER — Other Ambulatory Visit: Payer: Self-pay | Admitting: Family Medicine

## 2023-10-21 DIAGNOSIS — F411 Generalized anxiety disorder: Secondary | ICD-10-CM

## 2023-11-09 ENCOUNTER — Encounter: Payer: Self-pay | Admitting: Internal Medicine

## 2023-11-09 ENCOUNTER — Ambulatory Visit: Admitting: Internal Medicine

## 2023-11-09 VITALS — BP 136/88 | HR 78 | Ht 61.0 in | Wt 169.4 lb

## 2023-11-09 DIAGNOSIS — F17210 Nicotine dependence, cigarettes, uncomplicated: Secondary | ICD-10-CM

## 2023-11-09 DIAGNOSIS — J449 Chronic obstructive pulmonary disease, unspecified: Secondary | ICD-10-CM | POA: Diagnosis not present

## 2023-11-09 DIAGNOSIS — F17200 Nicotine dependence, unspecified, uncomplicated: Secondary | ICD-10-CM

## 2023-11-09 DIAGNOSIS — J410 Simple chronic bronchitis: Secondary | ICD-10-CM

## 2023-11-09 MED ORDER — BREZTRI AEROSPHERE 160-9-4.8 MCG/ACT IN AERO: 2.0000 | INHALATION_SPRAY | Freq: Two times a day (BID) | RESPIRATORY_TRACT | 11 refills | Status: DC

## 2023-11-09 MED ORDER — ALBUTEROL SULFATE HFA 108 (90 BASE) MCG/ACT IN AERS: 2.0000 | INHALATION_SPRAY | RESPIRATORY_TRACT | 5 refills | Status: AC | PRN

## 2023-11-09 NOTE — Patient Instructions (Addendum)
 It was a pleasure to see you today!  Please schedule follow up with myself in 6 months.  If my schedule is not open yet, we will contact you with a reminder closer to that time. Please call [phone] if you haven't heard from us  a month before, and always call us  sooner if issues or concerns arise. You can also send us  a message through MyChart, but but aware that this is not to be used for urgent issues and it may take up to 5-7 days to receive a reply. Please be aware that you will likely be able to view your results before I have a chance to respond to them. Please give us  5 business days to respond to any non-urgent results.    Stop stiolto inhaler. I am switching you to breztri 2 puffs twice daily, gargle after use.   Continue albuterol  inhaler as needed.   Try the nicotine gum to help with quitting smoking as we discussed.   What are the benefits of quitting smoking? Quitting smoking can lower your chances of getting or dying from heart disease, lung disease, kidney failure, infection, or cancer. It can also lower your chances of getting osteoporosis, a condition that makes your bones weak. Plus, quitting smoking can help your skin look younger and reduce the chances that you will have problems with sex.  Quitting smoking will improve your health no matter how old you are, and no matter how long or how much you have smoked.  What should I do if I want to quit smoking? The letters in the word "START" can help you remember the steps to take: S = Set a quit date. T = Tell family, friends, and the people around you that you plan to quit. A = Anticipate or plan ahead for the tough times you'll face while quitting. R = Remove cigarettes and other tobacco products from your home, car, and work. T = Talk to your doctor about getting help to quit.  How can my doctor or nurse help? Your doctor or nurse can give you advice on the best way to quit. He or she can also put you in touch with  counselors or other people you can call for support. Plus, your doctor or nurse can give you medicines to: ?Reduce your craving for cigarettes ?Reduce the unpleasant symptoms that happen when you stop smoking (called "withdrawal symptoms"). You can also get help from a free phone line (1-800-QUIT-NOW) or go online to [employer].  What are the symptoms of withdrawal? The symptoms include: ?Trouble sleeping ?Being irritable, anxious or restless ?Getting frustrated or angry ?Having trouble thinking clearly  Some people who stop smoking become temporarily depressed. Some people need treatment for depression, such as counseling or antidepressant medicines. Depressed people might: ?No longer enjoy or care about doing the things they used to like to do ?Feel sad, down, hopeless, nervous, or cranky most of the day, almost every day ?Lose or gain weight ?Sleep too much or too little ?Feel tired or like they have no energy ?Feel guilty or like they are worth nothing ?Forget things or feel confused ?Move and speak more slowly than usual ?Act restless or have trouble staying still ?Think about death or suicide  If you think you might be depressed, see your doctor or nurse. Only someone trained in mental health can tell for sure if you are depressed. If you ever feel like you might hurt yourself, go straight to the nearest emergency department. Or  you can call for an ambulance (in the [place]  and [place], dial 9-1-1) or call your doctor or nurse right away and tell them it is an emergency. You can also reach the [place]  National Suicide Prevention Lifeline at 657-447-7489 or http://hill.com/.  How do medicines help you stop smoking? Different medicines work in different ways: ?Nicotine replacement therapy eases withdrawal and reduces your body's craving for nicotine, the main drug found in cigarettes. There are different forms of nicotine replacement, including skin patches, lozenges,  gum, nasal sprays, and "puffers" or inhalers. Many can be bought without a prescription, while others might require one. ?Bupropion  is a prescription medicine that reduces your desire to smoke. This medicine is sold under the brand names Zyban  and Wellbutrin . It is also available in a generic version, which is cheaper than brand name medicines. ?Varenicline  (brand names: Chantix , Champix) is a prescription medicine that reduces withdrawal symptoms and cigarette cravings. If you think you'd like to take varenicline  and you have a history of depression, anxiety, or heart disease, discuss this with your doctor or nurse before taking the medicine. Varenicline  can also increase the effects of alcohol in some people. It's a good idea to limit drinking while you're taking it, at least until you know how it affects you.  How does counseling work? Counseling can happen during formal office visits or just over the phone. A counselor can help you: ?Figure out what triggers your smoking and what to do instead ?Overcome cravings ?Figure out what went wrong when you tried to quit before  What works best? Studies show that people have the best luck at quitting if they take medicines to help them quit and work with a Veterinary surgeon. It might also be helpful to combine nicotine replacement with one of the prescription medicines that help people quit. In some cases, it might even make sense to take bupropion  and varenicline  together.  What about e-cigarettes? Sometimes people wonder if using electronic cigarettes, or "e-cigarettes," might help them quit smoking. Using e-cigarettes is also called "vaping." Doctors do not recommend e-cigarettes in place of medicines and counseling. That's because e-cigarettes still contain nicotine as well as other substances that might be harmful. It's not clear how they can affect a person's health in the long term.  Will I gain weight if I quit? Yes, you might gain a few pounds. But  quitting smoking will have a much more positive effect on your health than weighing a few pounds more. Plus, you can help prevent some weight gain by being more active and eating less. Taking the medicine bupropion  might help control weight gain.   What else can I do to improve my chances of quitting? You can: ?Start exercising. ?Stay away from smokers and places that you associate with smoking. If people close to you smoke, ask them to quit with you. ?Keep gum, hard candy, or something to put in your mouth handy. If you get a craving for a cigarette, try one of these instead. ?Don't give up, even if you start smoking again. It takes most people a few tries before they succeed.  What if I am pregnant and I smoke? If you are pregnant, it's really important for the health of your baby that you quit. Ask your doctor what options you have, and what is safest for your baby

## 2023-11-09 NOTE — Progress Notes (Signed)
 Donna Bright    [id number]    1981/04/21  Primary Care Physician:Stacks, Vallorie Gayer, MD Date of Appointment: 11/09/2023 Established Patient Visit  Chief complaint:   Chief Complaint  Patient presents with   Follow-up     HPI: Donna Bright is a 43 y.o. woman with gold stage 0 everyday smoking use.   Interval Updates: Here for follow up after over 2 years. Had L5 radiculopathy and lost her job working at [employer] her breathing has gotten gradually worse.   No exacerbations of COPD in the interim.   Still on stiolto . 2 puffs once daily. Takes albuterol  about 3-4 times/week usually with dyspnea triggered by exertion.   Intolerant of chantix  and wellbutrin  Albuterol  use is 2-3 times a week.   Still smoking but down to 1/2 ppd.   I have reviewed the patient's family social and past medical history and updated as appropriate.   Past Medical History:  Diagnosis Date   Anxiety    Chronic bronchitis (HCC)    History of chicken pox    Hypertension    Migraines    Pneumonia    Vitamin D  insufficiency     Past Surgical History:  Procedure Laterality Date   CESAREAN SECTION      Family History  Problem Relation Age of Onset   Stroke Maternal Grandfather    Diabetes Maternal Grandfather    Emphysema Maternal Uncle     Social History   Occupational History   Not on file  Tobacco Use   Smoking status: Some Days    Current packs/day: 0.50    Types: Cigarettes   Smokeless tobacco: Former   Tobacco comments:    1/2 pack a day 11/09/2023  Substance and Sexual Activity   Alcohol use: No    Alcohol/week: 0.0 standard drinks of alcohol   Drug use: No   Sexual activity: Never    Birth control/protection: None     Physical Exam: Blood pressure 136/88, pulse 78, height 5\' 1"  (1.549 m), weight 169 lb 6.4 oz (76.8 kg), SpO2 97%.  Gen:      No acute distress Lungs:    No increased respiratory effort, symmetric chest wall excursion, clear to  auscultation bilaterally, no wheezes or crackles CV:         Regular rate and rhythm; no murmurs, rubs, or gallops.  No pedal edema   Data Reviewed: Imaging: I have personally reviewed the chest xray Nov 2022 - no acute cardiopulmonary process  PFTs:     Latest Ref Rng & Units 08/01/2021    8:57 AM  PFT Results  FVC-Pre L 2.56   FVC-Predicted Pre % 72   FVC-Post L 2.41   FVC-Predicted Post % 68   Pre FEV1/FVC % % 83   Post FEV1/FCV % % 84   FEV1-Pre L 2.12   FEV1-Predicted Pre % 73   FEV1-Post L 2.04   DLCO uncorrected ml/min/mmHg 14.54   DLCO UNC% % 69   DLCO corrected ml/min/mmHg 14.58   DLCO COR %Predicted % 70   DLVA Predicted % 89   TLC L 4.23   TLC % Predicted % 87   RV % Predicted % 113    I have personally reviewed the patient's PFTs and normal pulmonary function.   Labs:  Immunization status: Immunization History  Administered Date(s) Administered   PFIZER(Purple Top)SARS-COV-2 Vaccination 09/15/2019, 10/06/2019   PNEUMOCOCCAL CONJUGATE-20 07/18/2021   Tdap 05/26/2010, 07/18/2021  Assessment:  COPD, Gold stage 0. Improved control Encounter for smoking cessation  Plan/Recommendations:  Stop stiolto inhaler. I am switching you to breztri 2 puffs twice daily, gargle after use.   Continue albuterol  inhaler as needed.   Try the nicotine gum to help with quitting smoking as we discussed.   Smoking Cessation Counseling:  1. The patient is an everyday smoker and symptomatic due to the following condition copd 2. The patient is currently contemplative in quitting smoking. 3. I advised patient to quit smoking. 4. We identified patient specific barriers to change.  5. I personally spent 5 minutes counseling the patient regarding tobacco use disorder. 6. We discussed management of stress and anxiety to help with smoking cessation, when applicable. 7. We discussed nicotine replacement therapy, Wellbutrin , Chantix  as possible options. 8. I advised  setting a quit date. 9. Follow?up arranged with our office to continue ongoing discussions. 10.Resources given to patient including quit hotline.     Return to Care: Return in about 6 months (around 05/11/2024).   Louie Rover, MD Pulmonary and Critical Care Medicine [facility] [place] Office:[phone]

## 2023-11-10 ENCOUNTER — Encounter: Payer: Self-pay | Admitting: Internal Medicine

## 2023-11-10 ENCOUNTER — Telehealth: Payer: Self-pay

## 2023-11-10 NOTE — Telephone Encounter (Signed)
 PA request has been Submitted. New Encounter has been or will be created for follow up. For additional info see Pharmacy Prior Auth telephone encounter from 05/06.

## 2023-11-10 NOTE — Telephone Encounter (Signed)
*  Pulm  Pharmacy Patient Advocate Encounter   Received notification from CoverMyMeds that prior authorization for Breztri Aerosphere 160-9-4.8MCG/ACT aerosol  is required/requested.   Insurance verification completed.   The patient is insured through [facility] .   Per test claim: PA required; PA submitted to above mentioned insurance via CoverMyMeds Key/confirmation #/EOC [id number] Status is pending

## 2023-11-10 NOTE — Telephone Encounter (Signed)
 Can you check on this?

## 2023-11-11 MED ORDER — FLUTICASONE-SALMETEROL 230-21 MCG/ACT IN AERO: 2.0000 | INHALATION_SPRAY | Freq: Two times a day (BID) | RESPIRATORY_TRACT | 12 refills | Status: AC

## 2023-11-11 NOTE — Telephone Encounter (Signed)
 Pharmacy Patient Advocate Encounter  Received notification from [facility] that Prior Authorization for Donna Bright has been DENIED.  Full denial letter will be uploaded to the media tab. See denial reason below.

## 2023-11-11 NOTE — Telephone Encounter (Signed)
 Let's try advair brand first. Sent to pharmacy.

## 2023-11-11 NOTE — Telephone Encounter (Signed)
 Dr. Dione Franks, The patient's [facility] required a PA.  Please see the covered medications per pharmacy team.  Thank you.

## 2023-12-24 ENCOUNTER — Emergency Department (HOSPITAL_BASED_OUTPATIENT_CLINIC_OR_DEPARTMENT_OTHER)
Admission: EM | Admit: 2023-12-24 | Discharge: 2023-12-24 | Disposition: A | Discharge status: Home / Self Care | Attending: Emergency Medicine | Admitting: Emergency Medicine

## 2023-12-24 ENCOUNTER — Other Ambulatory Visit: Payer: Self-pay

## 2023-12-24 ENCOUNTER — Encounter (HOSPITAL_BASED_OUTPATIENT_CLINIC_OR_DEPARTMENT_OTHER): Payer: Self-pay

## 2023-12-24 DIAGNOSIS — R739 Hyperglycemia, unspecified: Secondary | ICD-10-CM

## 2023-12-24 LAB — BASIC METABOLIC PANEL WITH GFR
Anion gap: 14 (ref 5–15)
BUN: 17 mg/dL (ref 6–20)
CO2: 21 mmol/L — ABNORMAL LOW (ref 22–32)
Calcium: 9.2 mg/dL (ref 8.9–10.3)
Chloride: 101 mmol/L (ref 98–111)
Creatinine, Ser: 0.87 mg/dL (ref 0.44–1.00)
GFR, Estimated: >60 mL/min (ref >60)
Glucose, Bld: 94 mg/dL (ref 70–99)
Potassium: 3.9 mmol/L (ref 3.5–5.1)
Sodium: 136 mmol/L (ref 135–145)

## 2023-12-24 LAB — CBC
HCT: 35.8 % — ABNORMAL LOW (ref 36.0–46.0)
Hemoglobin: 12.1 g/dL (ref 12.0–15.0)
MCH: 28.8 pg (ref 26.0–34.0)
MCHC: 33.8 g/dL (ref 30.0–36.0)
MCV: 85.2 fL (ref 80.0–100.0)
Platelets: 298 10*3/uL (ref 150–400)
RBC: 4.2 MIL/uL (ref 3.87–5.11)
RDW: 14.8 % (ref 11.5–15.5)
WBC: 8.3 10*3/uL (ref 4.0–10.5)
nRBC: 0 % (ref 0.0–0.2)

## 2023-12-24 LAB — URINALYSIS, ROUTINE W REFLEX MICROSCOPIC
Bilirubin Urine: NEGATIVE
Glucose, UA: NEGATIVE mg/dL
Hgb urine dipstick: NEGATIVE
Ketones, ur: NEGATIVE mg/dL
Leukocytes,Ua: NEGATIVE
Nitrite: NEGATIVE
Protein, ur: NEGATIVE mg/dL
Specific Gravity, Urine: 1.018 (ref 1.005–1.030)
pH: 6 (ref 5.0–8.0)

## 2023-12-24 LAB — CBG MONITORING, ED: Glucose-Capillary: 137 mg/dL — ABNORMAL HIGH (ref 70–99)

## 2023-12-24 LAB — PREGNANCY, URINE: Preg Test, Ur: NEGATIVE

## 2023-12-24 IMAGING — MG MM DIGITAL SCREENING BILAT W/ TOMO AND CAD
6 of 10 series · 6 of 30 positions shown · non-contrast
Comparison: None.

CLINICAL DATA: Screening.

EXAM:
DIGITAL SCREENING BILATERAL MAMMOGRAM WITH TOMOSYNTHESIS AND CAD
TECHNIQUE: Bilateral screening digital craniocaudal and mediolateral oblique
mammograms were obtained. Bilateral screening digital breast
tomosynthesis was performed. The images were evaluated with
computer-aided detection.

[R MLO synth-2D]
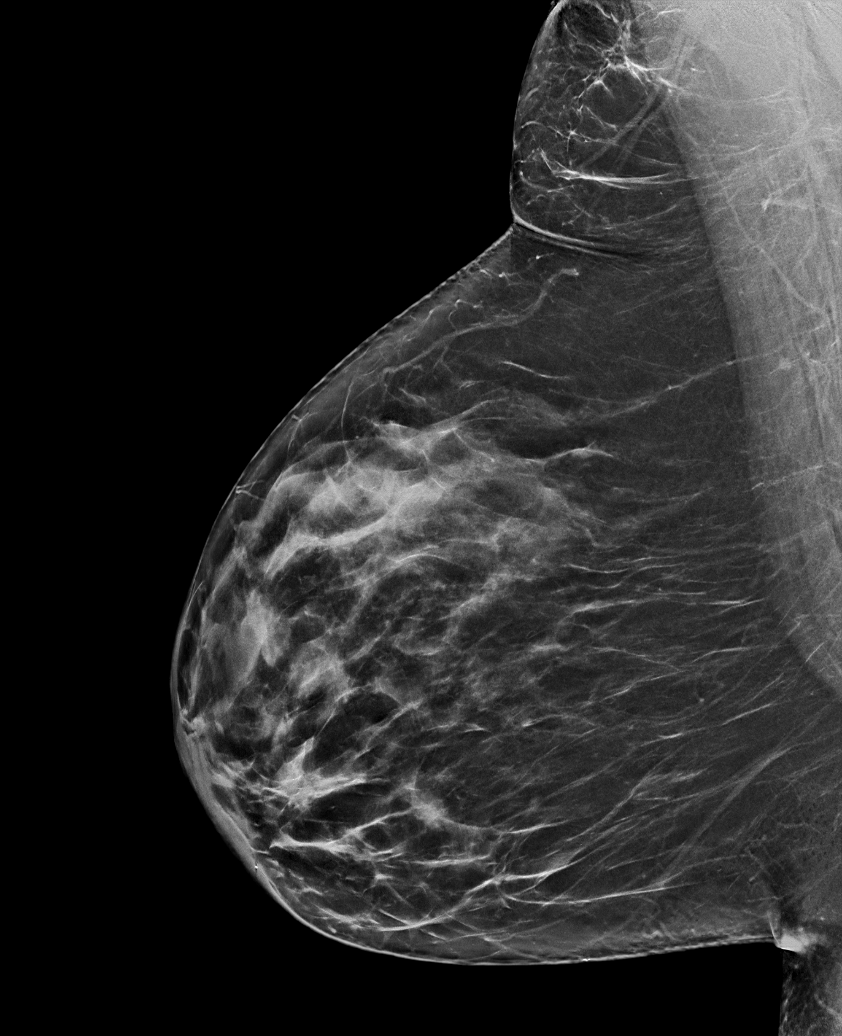

[L MLO synth-2D (1 of 2)]
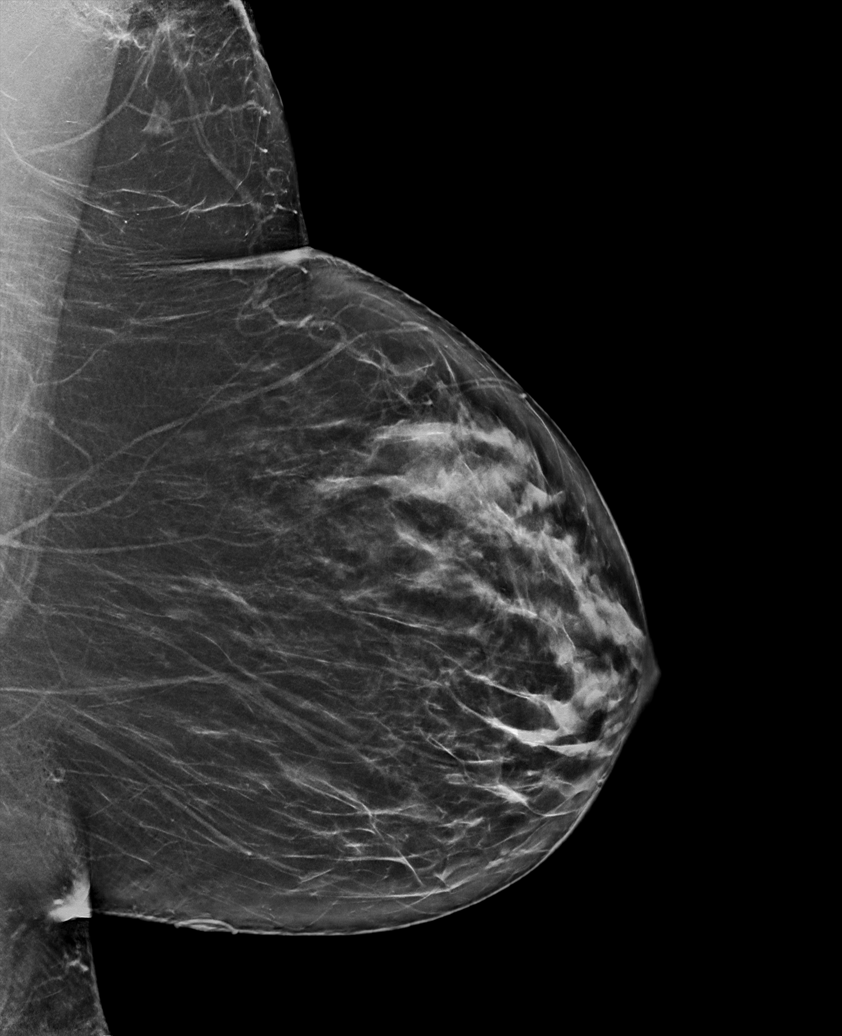

[L CC synth-2D]
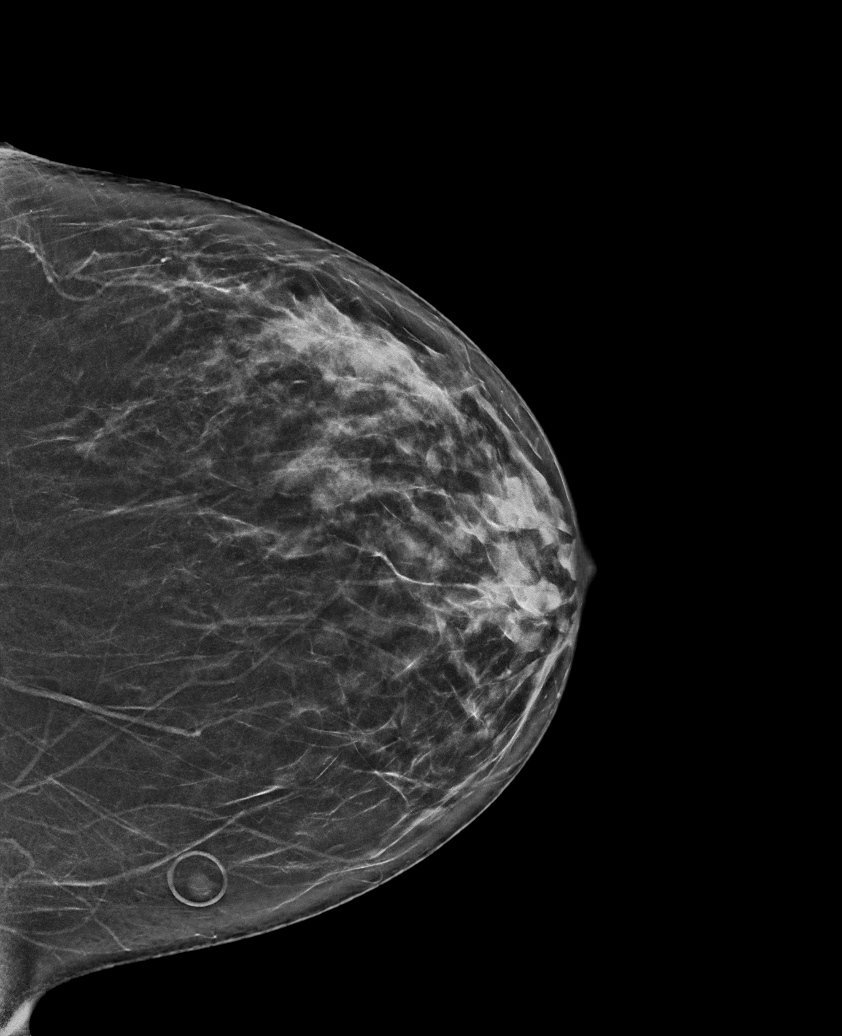

[L MLO synth-2D (2 of 2)]
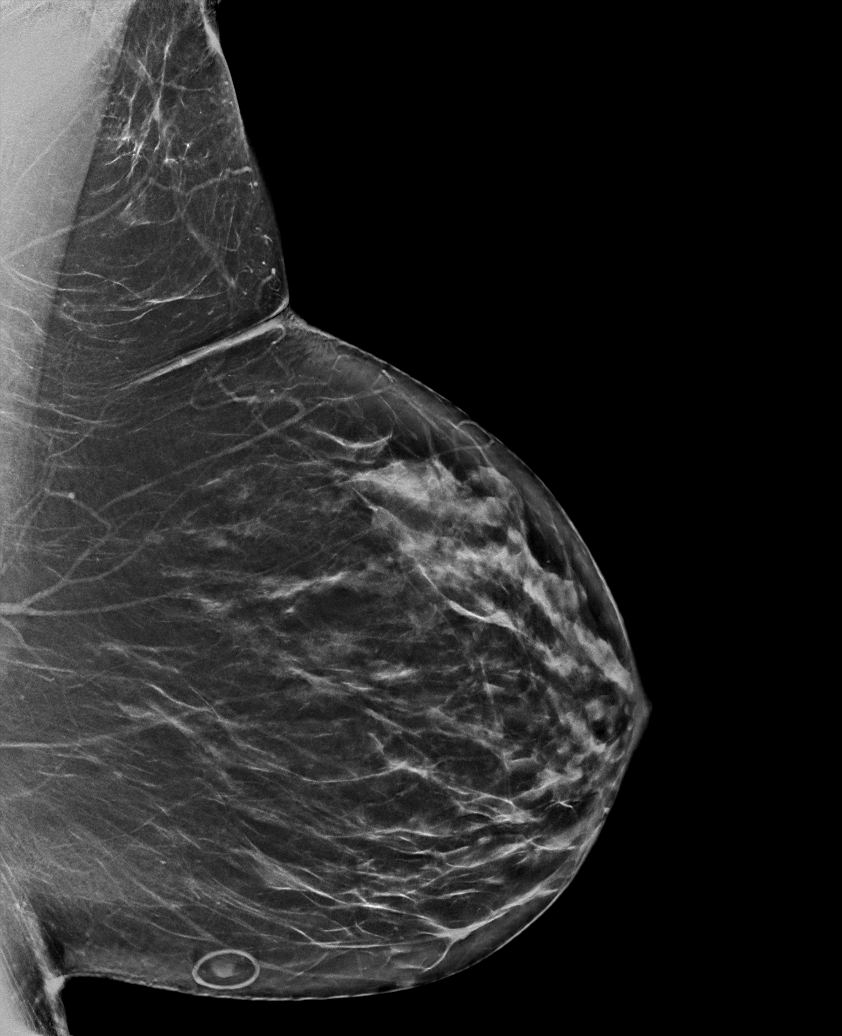

[R CC synth-2D]
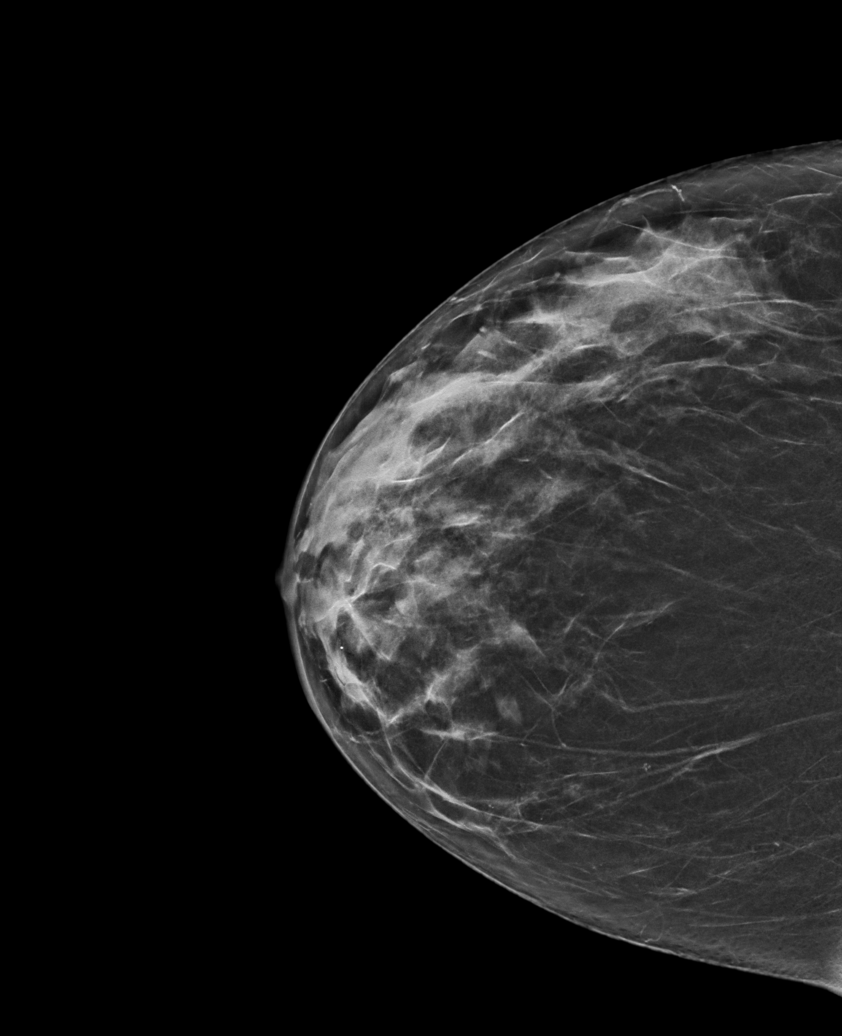

[R CC tomo · tomo slice 33/66.0]
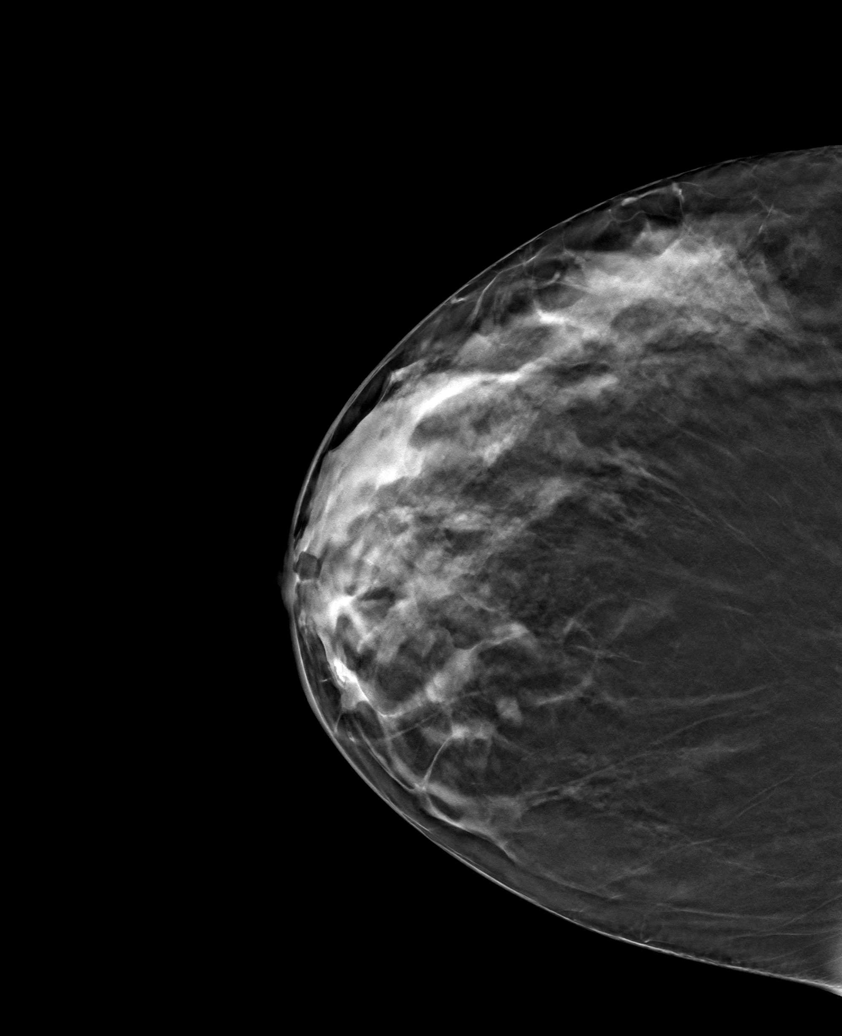

[6 of 30 positions shown; findings below may reference images not displayed]

ACR Breast Density Category c: The breast tissue is heterogeneously
dense, which may obscure small masses
FINDINGS: There are no findings suspicious for malignancy.
IMPRESSION: No mammographic evidence of malignancy. A result letter of this
screening mammogram will be mailed directly to the patient.

RECOMMENDATION:
Screening mammogram in one year. (Code:C8-T-HNK)

BI-RADS CATEGORY  1: Negative.

## 2023-12-24 NOTE — ED Provider Notes (Signed)
 [name] AT [facility] Provider Note   CSN: [id number] Arrival date & time: 12/24/23  4098     Patient presents with: Blood Sugar Problem   Donna Bright is a 43 y.o. female.   43 year old patient presents due to labile blood sugars.  Patient has a blood glucose monitor.  States that she had periods of hypoglycemia as well as hyperglycemia.  She has not been formally diagnosed with diabetes.  Was seen in the doctor'Bright office today and was sent to for further evaluation of the blood sugars in the office were elevated.  Prior to coming here, patient had chicken nuggets, regular Dr. Kathlene Bright, [place] fries.  She has not had any abdominal pain she has had no emesis.  Feels at her baseline.       Prior to Admission medications   Medication Sig Start Date End Date Taking? Authorizing Provider  albuterol  (VENTOLIN  HFA) 108 (90 Base) MCG/ACT inhaler Inhale 2 puffs into the lungs every 4 (four) hours as needed for wheezing or shortness of breath. 11/09/23   Bright, Donna Bright, Bright  celecoxib (CELEBREX) 200 MG capsule Take 200 mg by mouth 2 (two) times daily. 11/03/23 02/01/24  Provider, Historical, Bright  cyclobenzaprine (FLEXERIL) 5 MG tablet Take 5 mg by mouth 3 (three) times daily as needed. 06/19/23   Provider, Historical, Bright  diclofenac  (VOLTAREN ) 75 MG EC tablet Take 1 tablet (75 mg total) by mouth 2 (two) times daily. 03/18/23   Donna Bright  escitalopram  (LEXAPRO ) 10 MG tablet Take 1 tablet (10 mg total) by mouth daily. **NEEDS TO BE SEEN BEFORE NEXT REFILL** 10/21/23   Donna Bright  fluticasone -salmeterol (ADVAIR HFA) 230-21 MCG/ACT inhaler Inhale 2 puffs into the lungs 2 (two) times daily. 11/11/23   Bright, Donna Bright, Bright  gabapentin (NEURONTIN) 100 MG capsule Take 100 mg by mouth 2 (two) times daily. 10/07/23 01/05/24  Provider, Historical, Bright  gabapentin (NEURONTIN) 300 MG capsule Take 300 mg by mouth at bedtime.    Provider, Historical, Bright  ibuprofen (ADVIL)  800 MG tablet Take 800 mg by mouth 3 (three) times daily.    Provider, Historical, Bright  lisinopril  (ZESTRIL ) 10 MG tablet TAKE ONE TABLET ONCE DAILY 03/23/23   Donna Bright  meloxicam (MOBIC) 15 MG tablet Take 15 mg by mouth daily. 06/19/23   Provider, Historical, Bright  predniSONE  (DELTASONE ) 20 MG tablet One twice daily with food for 2 weeks. Then one daily for 2 weeks 03/18/23   Donna Bright  tiZANidine  (ZANAFLEX ) 4 MG tablet Take 1 tablet (4 mg total) by mouth every 6 (six) hours as needed for muscle spasms. 03/18/23   Donna Bright  varenicline  (CHANTIX  CONTINUING MONTH PAK) 1 MG tablet Take 1 tablet (1 mg total) by mouth 2 (two) times daily. 05/06/22   Donna Bright  Varenicline  Tartrate, Starter, (CHANTIX  STARTING MONTH PAK) 0.5 MG X 11 & 1 MG X 42 TBPK Use according to package directions 05/06/22   Donna Bright    Allergies: Septra [sulfamethoxazole-trimethoprim]    Review of Systems  All other systems reviewed and are negative.   Updated Vital Signs BP 139/88 (BP Location: Right Arm)   Pulse 90   Temp 97.9 F (36.6 C) (Oral)   Resp 18   Ht 1.549 m (5' 1)   Wt 77.1 kg   SpO2 99%   BMI 32.12 kg/m   Physical Exam Vitals and nursing note reviewed.  Constitutional:  General: She is not in acute distress.    Appearance: Normal appearance. She is well-developed. She is not toxic-appearing.  HENT:     Head: Normocephalic and atraumatic.   Eyes:     General: Lids are normal.     Conjunctiva/sclera: Conjunctivae normal.     Pupils: Pupils are equal, round, and reactive to light.   Neck:     Thyroid : No thyroid  mass.     Trachea: No tracheal deviation.   Cardiovascular:     Rate and Rhythm: Normal rate and regular rhythm.     Heart sounds: Normal heart sounds. No murmur heard.    No gallop.  Pulmonary:     Effort: Pulmonary effort is normal. No respiratory distress.     Breath sounds: Normal breath sounds. No stridor. No decreased breath  sounds, wheezing, rhonchi or rales.  Abdominal:     General: There is no distension.     Palpations: Abdomen is soft.     Tenderness: There is no abdominal tenderness. There is no rebound.   Musculoskeletal:        General: No tenderness. Normal range of motion.     Cervical back: Normal range of motion and neck supple.   Skin:    General: Skin is warm and dry.     Findings: No abrasion or rash.   Neurological:     Mental Status: She is alert and oriented to person, place, and time. Mental status is at baseline.     GCS: GCS eye subscore is 4. GCS verbal subscore is 5. GCS motor subscore is 6.     Cranial Nerves: No cranial nerve deficit.     Sensory: No sensory deficit.     Motor: Motor function is intact.   Psychiatric:        Attention and Perception: Attention normal.        Speech: Speech normal.        Behavior: Behavior normal.     (all labs ordered are listed, but only abnormal results are displayed) Labs Reviewed  CBG MONITORING, ED - Abnormal; Notable for the following components:      Result Value   Glucose-Capillary 137 (*)    All other components within normal limits  BASIC METABOLIC PANEL WITH GFR  CBC  URINALYSIS, ROUTINE W REFLEX MICROSCOPIC  PREGNANCY, URINE  CBG MONITORING, ED    EKG: None  Radiology: No results found.   Procedures   Medications Ordered in the ED - No data to display                                  Medical Decision Making Amount and/or Complexity of Data Reviewed Labs: ordered.   Patient'Bright labs reviewed here.  She did not have an issue with hyperglycemia.  Her blood sugar here is actually stable.  No evidence of DKA at this time.  No indication to start her on any medications.  She will follow-up with her doctor.     Final diagnoses:  None    ED Discharge Orders     None          Lind Repine, Bright 12/24/23 2131

## 2023-12-24 NOTE — ED Triage Notes (Signed)
 Pt reports blood sugar in 300's today. Pt went to PCP office and was told to come to ED for fluids and A1C check. Pt CBG in triage 137 mg/dL. Pt endorses increased thirst and polyuria.

## 2024-03-04 DIAGNOSIS — R7303 Prediabetes: Secondary | ICD-10-CM | POA: Insufficient documentation

## 2024-03-21 ENCOUNTER — Ambulatory Visit (INDEPENDENT_AMBULATORY_CARE_PROVIDER_SITE_OTHER): Admitting: Family Medicine

## 2024-03-21 DIAGNOSIS — Z91199 Patient's noncompliance with other medical treatment and regimen due to unspecified reason: Secondary | ICD-10-CM

## 2024-03-21 NOTE — Patient Instructions (Signed)

## 2024-03-21 NOTE — Progress Notes (Signed)
   No show new pt est. Please do not reschedule with this provider.
